# Patient Record
Sex: Male | Born: 1970 | Race: White | Hispanic: No | Marital: Single | Smoking: Current every day smoker
Health system: Southern US, Community
[De-identification: ages and names within clinical notes are randomized; demographics above are authoritative.]

## PROBLEM LIST (undated history)

## (undated) DIAGNOSIS — F1510 Other stimulant abuse, uncomplicated: Secondary | ICD-10-CM

## (undated) DIAGNOSIS — F1190 Opioid use, unspecified, uncomplicated: Secondary | ICD-10-CM

## (undated) HISTORY — PX: LEG SURGERY: SHX1003

## (undated) HISTORY — PX: ARM WOUND REPAIR / CLOSURE: SUR1141

## (undated) SURGERY — ECHOCARDIOGRAM, TRANSESOPHAGEAL
Anesthesia: Moderate Sedation | Laterality: Right

---

## 2004-02-27 ENCOUNTER — Emergency Department: Payer: Self-pay | Admitting: Emergency Medicine

## 2005-10-07 ENCOUNTER — Emergency Department: Payer: Self-pay | Admitting: Emergency Medicine

## 2009-07-09 ENCOUNTER — Emergency Department: Payer: Self-pay | Admitting: Emergency Medicine

## 2009-08-28 ENCOUNTER — Emergency Department: Payer: Self-pay | Admitting: Emergency Medicine

## 2010-08-21 ENCOUNTER — Emergency Department: Payer: Self-pay | Admitting: Emergency Medicine

## 2010-08-24 ENCOUNTER — Emergency Department: Payer: Self-pay | Admitting: Emergency Medicine

## 2011-11-27 ENCOUNTER — Ambulatory Visit: Payer: Self-pay | Admitting: Unknown Physician Specialty

## 2011-11-27 ENCOUNTER — Inpatient Hospital Stay: Payer: Self-pay | Admitting: Specialist

## 2011-11-27 LAB — DRUG SCREEN, URINE
Amphetamines, Ur Screen: NEGATIVE (ref ?–1000)
Barbiturates, Ur Screen: NEGATIVE (ref ?–200)
Benzodiazepine, Ur Scrn: NEGATIVE (ref ?–200)
Cannabinoid 50 Ng, Ur ~~LOC~~: POSITIVE (ref ?–50)
MDMA (Ecstasy)Ur Screen: NEGATIVE (ref ?–500)
Methadone, Ur Screen: NEGATIVE (ref ?–300)
Phencyclidine (PCP) Ur S: NEGATIVE (ref ?–25)

## 2011-11-27 LAB — BASIC METABOLIC PANEL
Anion Gap: 10 (ref 7–16)
Calcium, Total: 8.8 mg/dL (ref 8.5–10.1)
Co2: 24 mmol/L (ref 21–32)
EGFR (African American): >60
EGFR (Non-African Amer.): >60
Glucose: 77 mg/dL (ref 65–99)
Osmolality: 288 (ref 275–301)
Potassium: 3.9 mmol/L (ref 3.5–5.1)

## 2011-11-27 LAB — CBC WITH DIFFERENTIAL/PLATELET
Basophil #: 0.1 10*3/uL (ref 0.0–0.1)
Basophil %: 0.4 %
HCT: 48.2 % (ref 40.0–52.0)
HGB: 15.9 g/dL (ref 13.0–18.0)
Lymphocyte #: 2 10*3/uL (ref 1.0–3.6)
Lymphocyte %: 16.6 %
MCHC: 33 g/dL (ref 32.0–36.0)
Neutrophil #: 9.1 10*3/uL — ABNORMAL HIGH (ref 1.4–6.5)
Platelet: 208 10*3/uL (ref 150–440)
RBC: 5.38 10*6/uL (ref 4.40–5.90)

## 2011-11-27 LAB — URINALYSIS, COMPLETE
Bacteria: NONE SEEN
Bilirubin,UR: NEGATIVE
Blood: NEGATIVE
Leukocyte Esterase: NEGATIVE
Nitrite: NEGATIVE
Ph: 6 (ref 4.5–8.0)
Protein: NEGATIVE
Squamous Epithelial: <1

## 2011-11-27 LAB — PROTIME-INR: INR: 0.9

## 2011-11-27 LAB — APTT: Activated PTT: 23.1 secs — ABNORMAL LOW (ref 23.6–35.9)

## 2011-11-27 LAB — ETHANOL
Ethanol %: 0.221 % — ABNORMAL HIGH (ref 0.000–0.080)
Ethanol: 221 mg/dL

## 2011-12-01 ENCOUNTER — Emergency Department: Payer: Self-pay | Admitting: Emergency Medicine

## 2012-10-28 ENCOUNTER — Emergency Department: Payer: Self-pay | Admitting: Internal Medicine

## 2012-10-28 ENCOUNTER — Emergency Department: Payer: Self-pay | Admitting: Emergency Medicine

## 2013-03-21 LAB — CBC
HGB: 15.3 g/dL (ref 13.0–18.0)
MCH: 29.7 pg (ref 26.0–34.0)
MCHC: 34.3 g/dL (ref 32.0–36.0)
Platelet: 240 10*3/uL (ref 150–440)
RBC: 5.15 10*6/uL (ref 4.40–5.90)
RDW: 14.7 % — ABNORMAL HIGH (ref 11.5–14.5)
WBC: 8.9 10*3/uL (ref 3.8–10.6)

## 2013-03-21 LAB — DRUG SCREEN, URINE
Benzodiazepine, Ur Scrn: NEGATIVE (ref ?–200)
Cocaine Metabolite,Ur ~~LOC~~: POSITIVE (ref ?–300)
MDMA (Ecstasy)Ur Screen: NEGATIVE (ref ?–500)
Methadone, Ur Screen: NEGATIVE (ref ?–300)
Opiate, Ur Screen: NEGATIVE (ref ?–300)

## 2013-03-21 LAB — COMPREHENSIVE METABOLIC PANEL
Alkaline Phosphatase: 89 U/L
Calcium, Total: 9.2 mg/dL (ref 8.5–10.1)
Chloride: 110 mmol/L — ABNORMAL HIGH (ref 98–107)
Co2: 24 mmol/L (ref 21–32)
Creatinine: 0.95 mg/dL (ref 0.60–1.30)
EGFR (African American): >60
EGFR (Non-African Amer.): >60
SGOT(AST): 35 U/L (ref 15–37)
SGPT (ALT): 29 U/L (ref 12–78)
Total Protein: 8.4 g/dL — ABNORMAL HIGH (ref 6.4–8.2)

## 2013-03-21 LAB — ETHANOL
Ethanol %: 0.291 % — ABNORMAL HIGH (ref 0.000–0.080)
Ethanol: 291 mg/dL

## 2013-03-21 LAB — ACETAMINOPHEN LEVEL: Acetaminophen: <2 ug/mL

## 2013-03-22 ENCOUNTER — Inpatient Hospital Stay: Payer: Self-pay | Admitting: Psychiatry

## 2013-03-22 LAB — ETHANOL: Ethanol: <3 mg/dL

## 2013-04-09 ENCOUNTER — Emergency Department: Payer: Self-pay | Admitting: Emergency Medicine

## 2013-04-09 LAB — CBC
HCT: 45.5 % (ref 40.0–52.0)
HGB: 15.4 g/dL (ref 13.0–18.0)
MCH: 29.5 pg (ref 26.0–34.0)
MCHC: 33.8 g/dL (ref 32.0–36.0)
MCV: 87 fL (ref 80–100)
Platelet: 210 10*3/uL (ref 150–440)
RBC: 5.22 10*6/uL (ref 4.40–5.90)
RDW: 15.4 % — ABNORMAL HIGH (ref 11.5–14.5)
WBC: 14 10*3/uL — ABNORMAL HIGH (ref 3.8–10.6)

## 2013-04-09 LAB — BASIC METABOLIC PANEL
Anion Gap: 7 (ref 7–16)
Calcium, Total: 9.1 mg/dL (ref 8.5–10.1)
Chloride: 104 mmol/L (ref 98–107)
Creatinine: 1.09 mg/dL (ref 0.60–1.30)
Glucose: 84 mg/dL (ref 65–99)
Potassium: 3.9 mmol/L (ref 3.5–5.1)

## 2013-04-09 LAB — ETHANOL
Ethanol %: 0.02 % (ref 0.000–0.080)
Ethanol: 20 mg/dL

## 2013-04-11 ENCOUNTER — Emergency Department: Payer: Self-pay | Admitting: Emergency Medicine

## 2013-04-14 ENCOUNTER — Emergency Department: Payer: Self-pay | Admitting: Internal Medicine

## 2013-04-14 LAB — DRUG SCREEN, URINE
AMPHETAMINES, UR SCREEN: NEGATIVE (ref ?–1000)
BARBITURATES, UR SCREEN: NEGATIVE (ref ?–200)
BENZODIAZEPINE, UR SCRN: NEGATIVE (ref ?–200)
Cannabinoid 50 Ng, Ur ~~LOC~~: NEGATIVE (ref ?–50)
Cocaine Metabolite,Ur ~~LOC~~: POSITIVE (ref ?–300)
MDMA (Ecstasy)Ur Screen: NEGATIVE (ref ?–500)
Methadone, Ur Screen: NEGATIVE (ref ?–300)
OPIATE, UR SCREEN: POSITIVE (ref ?–300)
Phencyclidine (PCP) Ur S: NEGATIVE (ref ?–25)
Tricyclic, Ur Screen: NEGATIVE (ref ?–1000)

## 2013-04-16 ENCOUNTER — Emergency Department: Payer: Self-pay | Admitting: Emergency Medicine

## 2013-04-18 ENCOUNTER — Emergency Department: Payer: Self-pay | Admitting: Emergency Medicine

## 2013-05-28 ENCOUNTER — Emergency Department: Payer: Self-pay | Admitting: Emergency Medicine

## 2013-05-28 LAB — COMPREHENSIVE METABOLIC PANEL
ALK PHOS: 98 U/L
ANION GAP: 4 — AB (ref 7–16)
Albumin: 4.1 g/dL (ref 3.4–5.0)
BILIRUBIN TOTAL: 0.3 mg/dL (ref 0.2–1.0)
BUN: 7 mg/dL (ref 7–18)
Calcium, Total: 8.6 mg/dL (ref 8.5–10.1)
Chloride: 110 mmol/L — ABNORMAL HIGH (ref 98–107)
Co2: 25 mmol/L (ref 21–32)
Creatinine: 1.08 mg/dL (ref 0.60–1.30)
Glucose: 98 mg/dL (ref 65–99)
Osmolality: 275 (ref 275–301)
Potassium: 3.9 mmol/L (ref 3.5–5.1)
SGOT(AST): 37 U/L (ref 15–37)
SGPT (ALT): 28 U/L (ref 12–78)
Sodium: 139 mmol/L (ref 136–145)
TOTAL PROTEIN: 8.3 g/dL — AB (ref 6.4–8.2)

## 2013-05-28 LAB — DRUG SCREEN, URINE

## 2013-05-28 LAB — CBC
HCT: 49.2 % (ref 40.0–52.0)
HGB: 16.3 g/dL (ref 13.0–18.0)
MCH: 29.5 pg (ref 26.0–34.0)
MCHC: 33 g/dL (ref 32.0–36.0)
MCV: 89 fL (ref 80–100)
Platelet: 225 10*3/uL (ref 150–440)
RBC: 5.51 10*6/uL (ref 4.40–5.90)
RDW: 15.7 % — AB (ref 11.5–14.5)
WBC: 5.9 10*3/uL (ref 3.8–10.6)

## 2013-05-28 LAB — URINALYSIS, COMPLETE
BACTERIA: NONE SEEN
Bilirubin,UR: NEGATIVE
Blood: NEGATIVE
GLUCOSE, UR: NEGATIVE mg/dL (ref 0–75)
Ketone: NEGATIVE
Leukocyte Esterase: NEGATIVE
NITRITE: NEGATIVE
Ph: 6 (ref 4.5–8.0)
Protein: NEGATIVE
RBC,UR: <1 /HPF (ref 0–5)
SPECIFIC GRAVITY: 1 (ref 1.003–1.030)
Squamous Epithelial: NONE SEEN
WBC UR: NONE SEEN /HPF (ref 0–5)

## 2013-05-28 LAB — SALICYLATE LEVEL: SALICYLATES, SERUM: 3.4 mg/dL — AB

## 2013-05-28 LAB — ETHANOL
ETHANOL %: 0.254 % — AB (ref 0.000–0.080)
Ethanol: 254 mg/dL

## 2013-05-28 LAB — ACETAMINOPHEN LEVEL: Acetaminophen: <2 ug/mL

## 2013-05-30 ENCOUNTER — Emergency Department: Payer: Self-pay | Admitting: Emergency Medicine

## 2014-07-29 NOTE — Op Note (Signed)
PATIENT NAME:  Reece LeaderRIDGEWAY, Gregg R MR#:  [id number] DATE OF BIRTH:  09/11/1970  DATE OF PROCEDURE:  11/27/2011  PREOPERATIVE DIAGNOSIS: Grade 2 open fracture left ulna.   POSTOPERATIVE DIAGNOSIS: Grade 2 open fracture left ulna.   PROCEDURE:  1. Open reduction, internal fixation left ulna.  2. Debridement of ulnar wound with debridement of subcutaneous tissue and a small amount of muscle.   SURGEON: Winn JockJames C. Auryn Paige, M.D.   ASSISTANT: None.   ANESTHESIA: General.   ESTIMATED BLOOD LOSS: Minimal.   TOURNIQUET PRESSURE: 250 mmHg.  TOURNIQUET TIME: 48 minutes.   IMPLANTS USED: Synthes nine-hole locking plate.   BRIEF CLINICAL NOTE AND PATHOLOGY: The patient suffered the above-mentioned fracture with a pickax. Reportedly a relatively clean instrument.  He was evaluated in the Emergency Room. No definite neurovascular compromise. Very difficult historian. The patient was intoxicated.   Options, risks, and benefits were discussed. At the time of the surgery there was minimal contamination. There was a small amount of nonviable muscle. There was a small amount of black foreign material on the bone. The fracture reduced nicely. Hardware had good fixation.   The injury appeared to be isolated to the ulna.   DESCRIPTION OF PROCEDURE: Adequate general anesthesia, supine position, routine prepping and draping. Appropriate time-out was called. A thorough Betadine prep had been performed. The initial wound was explored. It was approximately 3.5 inches long, oblique across the fracture site. This was then extended proximal and distal and directly along the ulnar shaft. The lateral aspect of the ulna was exposed. The area was thoroughly irrigated with pulsating lavage. Initially 3 liters were used. The wound was debrided. A small amount of muscle and subcutaneous tissue were removed. One small bone fragment was removed which was not attached to any soft tissue.   The fracture was then reduced under  direct visualization. A large butterfly fragment was secured to the proximal fragment with a lag screw through the plate. The plate was then attached proximally. The fracture was then reduced distally and secured in routine fashion with a combination of lag screws, cortical screws, and locking screws. All had excellent purchase. AP, lateral, and oblique views showed good positioning. The area was again thoroughly irrigated with pulsating lavage. Tourniquet was released. Hemostasis was good. The incision was closed using 0 Vicryl where the incision actually had been made. A staple was used in that same area. Nylon was used in the area of the open initial injury.   Soft sterile dressing was applied. Sponge and needle counts were reported as correct prior to and after wound closure. The patient was taken to the postanesthesia care unit having tolerated the procedure well.     ____________________________ Winn JockJames C. Gerrit Heckaliff, MD jcc:bjt D: 11/27/2011 09:59:42 ET T: 11/27/2011 13:04:45 ET JOB#: [id number]  cc: Winn JockJames C. Gerrit Heckaliff, MD, <Dictator> Winn JockJAMES C Candelario Steppe MD ELECTRONICALLY SIGNED 11/28/2011 11:42

## 2014-07-29 NOTE — H&P (Signed)
Subjective/Chief Complaint Left forearm open fracture    History of Present Illness Struck with a pickaxe on left forearm. Presented with open fracture of ulna. Denies other injuries. Diffuse numbness.    Past History Denies    Primary Physician None   Past Med/Surgical Hx:  gws to testicals.:   fx lower left ribs one month ago:   denies:   ALLERGIES:  No Known Allergies:   Family and Social History:   Family History Non-Contributory    Social History positive  tobacco, positive ETOH, positive Illicit drugs    + Tobacco Current (within 1 year)    Place of Living Home   Review of Systems:   Fever/Chills No    Cough No    Sputum No    Abdominal Pain No    Diarrhea No    Constipation No    Nausea/Vomiting No    SOB/DOE No    Chest Pain No    Dysuria No    Tolerating Diet Yes   Physical Exam:   GEN well developed    HEENT PERRL    NECK supple    RESP clear BS  Nontender    CARD regular rate  no murmur    ABD denies tenderness  soft    LYMPH negative neck, negative axillae    EXTR Dressing left forearm. Diffuse numbness of hand. Motor appears intact. Difficult to get full cooperation    SKIN Normal except laceration    NEURO motor/sensory function intact    PSYCH alert   Lab Results: Routine Chem:  18-Aug-13 05:12    Ethanol, S. 221   Ethanol % (comp)  0.221 (Result(s) reported on 27 Nov 2011 at 05:37AM.)   Glucose, Serum 77   BUN  20   Creatinine (comp) 1.22   Sodium, Serum 144   Potassium, Serum 3.9   Chloride, Serum  110   CO2, Serum 24   Calcium (Total), Serum 8.8   Anion Gap 10   Osmolality (calc) 288   eGFR (African American) >60   eGFR (Non-African American) >60 (eGFR values <20m/min/1.73 m2 may be an indication of chronic kidney disease (CKD). Calculated eGFR is useful in patients with stable renal function. The eGFR calculation will not be reliable in acutely ill patients when serum creatinine is changing rapidly.  It is not useful in  patients on dialysis. The eGFR calculation may not be applicable to patients at the low and high extremes of body sizes, pregnant women, and vegetarians.)  Urine Drugs:  [id number]   Tricyclic Antidepressant, Ur Qual (comp) NEGATIVE (Result(s) reported on 27 Nov 2011 at 05:47AM.)   Amphetamines, Urine Qual. NEGATIVE   MDMA, Urine Qual. NEGATIVE   Opiate, Urine qual NEGATIVE   Phencyclidine, Urine Qual. NEGATIVE   Barbiturates, Urine Qual. NEGATIVE   Benzodiazepine, Urine Qual. NEGATIVE (----------------- The URINE DRUG SCREEN provides only a preliminary, unconfirmed analytical test result and should not be used for non-medical  purposes.  Clinical consideration and professional judgment should be  applied to any positive drug screen result due to possible interfering substances.  A more specific alternate chemical method must be used in order to obtain a confirmed analytical result.  Gas chromatography/mass spectrometry (GC/MS) is the preferred confirmatory method.)   Methadone, Urine Qual. NEGATIVE  Routine UA:  18-Aug-13 05:27    Color (UA) Straw   Clarity (UA) Clear   Glucose (UA) Negative   Bilirubin (UA) Negative   Ketones (UA) Negative  Specific Gravity (UA) 1.008   Blood (UA) Negative   pH (UA) 6.0   Protein (UA) Negative   Nitrite (UA) Negative   Leukocyte Esterase (UA) Negative (Result(s) reported on 27 Nov 2011 at 05:50AM.)   RBC (UA) <1 /HPF   WBC (UA) NONE SEEN   Bacteria (UA) NONE SEEN   Epithelial Cells (UA) <1 /HPF   Mucous (UA) PRESENT (Result(s) reported on 27 Nov 2011 at 05:50AM.)  Routine Coag:  18-Aug-13 05:12    Prothrombin 12.0   INR 0.9 (INR reference interval applies to patients on anticoagulant therapy. A single INR therapeutic range for coumarins is not optimal for all indications; however, the suggested range for most indications is 2.0 - 3.0. Exceptions to the INR Reference Range may include: Prosthetic  heart valves, acute myocardial infarction, prevention of myocardial infarction, and combinations of aspirin and anticoagulant. The need for a higher or lower target INR must be assessed individually. Reference: The Pharmacology and Management of the Vitamin K  antagonists: the seventh ACCP Conference on Antithrombotic and Thrombolytic Therapy. MKLKJ.1791 Sept:126 (3suppl): N9146842. A HCT value >55% may artifactually increase the PT.  In one study,  the increase was an average of 25%. Reference:  "Effect on Routine and Special Coagulation Testing Values of Citrate Anticoagulant Adjustment in Patients with High HCT Values." American Journal of Clinical Pathology 2006;126:400-405.)   Activated PTT (APTT)  23.1 (A HCT value >55% may artifactually increase the APTT. In one study, the increase was an average of 19%. Reference: "Effect on Routine and Special Coagulation Testing Values of Citrate Anticoagulant Adjustment in Patients with High HCT Values." American Journal of Clinical Pathology 2006;126:400-405.)  Routine Hem:  18-Aug-13 05:12    WBC (CBC)  11.9   RBC (CBC) 5.38   Hemoglobin (CBC) 15.9   Hematocrit (CBC) 48.2   Platelet Count (CBC) 208   MCV 90   MCH 29.6   MCHC 33.0   RDW  14.9   Neutrophil % 76.4   Lymphocyte % 16.6   Monocyte % 5.5   Eosinophil % 1.1   Basophil % 0.4   Neutrophil #  9.1   Lymphocyte # 2.0   Monocyte # 0.7   Eosinophil # 0.1   Basophil # 0.1 (Result(s) reported on 27 Nov 2011 at 05:37AM.)     Assessment/Admission Diagnosis 1. Open left ulna fracture without definite NV injury 2. Alcohol intoxication 3. Tobacco abuse    Plan ORIF ulna, wound debridement. Discussed in detail risks and options, particularly infection,non union, stiffness, need for hardware removal and other surgery   Electronic Signatures: Lynnda Shields (MD)  (Signed [phone] 06:48)  Authored: CHIEF COMPLAINT and HISTORY, PAST MEDICAL/SURGIAL HISTORY, ALLERGIES, FAMILY AND  SOCIAL HISTORY, REVIEW OF SYSTEMS, PHYSICAL EXAM, LABS, ASSESSMENT AND PLAN   Last Updated: 18-Aug-13 06:48 by Lynnda Shields (MD)

## 2014-08-01 NOTE — Discharge Summary (Signed)
PATIENT NAME:  Erik Barker, Erik Barker MR#:  [id number] DATE OF BIRTH:  04/18/1970  DATE OF ADMISSION:  03/22/2013 DATE OF DISCHARGE:  03/25/2013.   HOSPITAL COURSE: See dictated history and physical for details of admission. This 44 year old man was admitted to the hospital intoxicated and making suicidal statements. Over the weekend he has detoxed and has not had a seizure or shown any sign of delirium. He completely denies any suicidal ideation. He did eventually speak to his family who confirmed to him that he had made suicidal statements while intoxicated, which he does not remember. The patient has been educated about the nature of blackouts and the dangerousness at this level of drinking creates. He was placed on Celexa, but today is refusing the medicine saying he does not like the side effects. He says his mood is feeling much better, and he denies any suicidal ideation. He has been counseled about the importance of monitoring his mood and following up with outpatient treatment so the depression can be treated if necessary and he agrees to the plan. He is not interested in going to inpatient rehab treatment instead will be discharged today with [facility]   MENTAL STATUS EXAM AT DISCHARGE: Neatly groomed man who looks his stated age, cooperative with the interview. Good eye contact. Normal psychomotor activity. Speech quiet and decreased in amount but easy to understand. Affect, smiling euthymic, reactive. Mood stated as good. Thoughts are lucid. No obvious loosening of associations or delusions. Denies auditory or visual hallucinations. Denies suicidal or homicidal ideation. Improved insight and judgment. Normal intelligence. Alert and oriented x 4.   LABORATORY DATA: At the time of admission included a chemistry panel with slightly elevated protein and chloride. No other abnormalities. Drug screen positive for cocaine. Alcohol level initially 291. Follow up the next day,  alcohol level of 0. Hematology panel unremarkable.   DISCHARGE MEDICATIONS: None.   DISPOSITION: Discharge home. Follow up with [facility] tomorrow.   DIAGNOSES:  PRINCIPAL AND PRIMARY AXIS I: Alcohol dependence.   SECONDARY DIAGNOSES: AXIS I: Substance-induced mood disorder.  AXIS II: Deferred.  AXIS III: No diagnosis.  AXIS IV: Moderate from multiple social stresses.  AXIS V: Functioning at time of discharge: 60.  ____________________________ Erik AmelJohn T. Avionna Bower, Barker jtc:sg D: 03/25/2013 12:19:43 ET T: 03/25/2013 13:58:43 ET JOB#: [id number]  cc: Erik AmelJohn T. Nixon Sparr, Barker, <Dictator> Erik AmelJOHN T Erik Barker ELECTRONICALLY SIGNED 03/26/2013 11:02

## 2014-08-01 NOTE — H&P (Signed)
PATIENT NAME:  Erik Barker, Erik Barker MR#:  [id number] DATE OF BIRTH:  1970-08-16  DATE OF ADMISSION:  03/21/2013  IDENTIFYING INFORMATION AND CHIEF COMPLAINT: A 44 year old man presented to the Emergency Room intoxicated.   CHIEF COMPLAINT: "I want to kill myself."   HISTORY OF PRESENT ILLNESS: Information obtained from the patient and the chart. The patient reports that he has been drinking and having thoughts about wanting to die. Mood has been depressed. He is not a very forthcoming historian, and has a hard time giving me the exact details of that, but says that he has been depressed and feeling bad for probably about a month. He drinks anywhere from one to several times a week. Also admits he has been using some cocaine. He endorses decreased energy, poor sleep, poor functioning, hopelessness, helplessness. Denies any hallucinations or psychotic symptoms.   PAST PSYCHIATRIC HISTORY: Denies any history of psychiatric treatment ever in the past. No psychiatric hospitalizations. Denies any history of suicidal behavior. Denies ever being on any psychiatric medicine.   SUBSTANCE ABUSE HISTORY: As has not been in detox or rehab before. No history of seizures or delirium tremens.   FAMILY HISTORY: Negative.   SOCIAL HISTORY: Not married, lives with his brother and a girlfriend. He does not give a lot of other social history date right now.  Does not report feeling unsafe,   PAST MEDICAL HISTORY: No significant ongoing medical problems. On no medications. Has a history of a broken arm, but no other medical issues.   MEDICATIONS: None.   ALLERGIES: No known drug allergies.   REVIEW OF SYSTEMS: Depressed mood, fatigue, suicidal ideation. No homicidal ideation. No hallucinations, no pain.   MENTAL STATUS EXAMINATION: Disheveled gentleman looks older than his stated age. Barely cooperative. Eye contact minimal. Psychomotor activity limited. Speech decreased in total amount. Affect blunted. Mood stated  as bad. Thoughts are lucid. No obvious loosening of associations or delusions, but minimal in amount. Denies auditory or visual hallucinations. Endorses suicidal ideation. Denies homicidal ideation. Judgment and insight appear to be at least partially impaired. Alert and oriented x 4, appears to be of probably average intelligence.   PHYSICAL EXAMINATION: GENERAL: No acute medical distress obvious.  SKIN: No skin lesions noted.  HEENT: Pupils equal and reactive. Face symmetric.  MUSCULOSKELETAL: Full range of motion at all extremities. Normal gait. Strength and reflexes normal and symmetric throughout.  LUNGS: Clear, no wheezes.  HEART: Regular rate and rhythm.  ABDOMEN: Soft, nontender, normal bowel sounds.  VITAL SIGNS: Pulse 79, respirations 18, blood pressure 110/64.   LABORATORY RESULTS: Drug screen positive for cocaine. Alcohol level on presentation 291. Chemistry panel slightly high chloride 110. No other significant abnormalities. CBC is unremarkable.   ASSESSMENT: A 44 year old man presented intoxicated voicing suicidal ideation. He continues to voice suicidal ideation. Does not appear to have severe withdrawal symptoms but is depressed. Not obviously psychotic. Needs admission to the hospital for stabilization.   TREATMENT PLAN: Admit to psychiatry. Suicide precautions. Detox protocol in place. Reassess as he stabilizes for mental health treatment.   DIAGNOSIS, PRINCIPAL AND PRIMARY:   AXIS I: Depression, not otherwise specified.   SECONDARY DIAGNOSES: AXIS I: Alcohol dependence.   AXIS II: Deferred.   AXIS III: Alcohol withdrawal.   AXIS IV: Severe.   AXIS V: Functioning at time of evaluation 30.    ____________________________ Audery AmelJohn T. Clapacs, MD jtc:cc D: 03/21/2013 17:28:17 ET T: 03/21/2013 18:20:25 ET JOB#: [id number]  cc: Audery AmelJohn T. Clapacs, MD, <Dictator> Audery AmelJOHN T CLAPACS  MD ELECTRONICALLY SIGNED 03/22/2013 15:42

## 2014-08-02 NOTE — Consult Note (Signed)
PATIENT NAME:  Erik Barker, [name]:  [id number] DATE OF BIRTH:  05-17-1970  DATE OF CONSULTATION:  05/28/2013  REFERRING PHYSICIAN:  Enedina Finnerandolph N. Manson PasseyBrown, MD CONSULTING PHYSICIAN:  Wister Hoefle B. Jennet MaduroPucilowska, MD  IDENTIFYING DATA:  Mr. Erik Barker is a 44 year old male with history of alcoholism.    REASON FOR CONSULTATION:   To evaluate a suicidal patient.    CHIEF COMPLAINT:  "I'm fine now."  HISTORY OF PRESENT ILLNESS:  Mr. Erik Barker has a long history of drinking.  He was hospitalized at [facility] in December of 2014 for suicidal ideation and alcohol detox.  He reports that he was able to slow down his drinking but lately, in January, he started drinking more.  He denies drinking daily.  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  He was drinking with his brother last night and started making suicidal threats.  The brother brought him to the hospital. Reportedly, he had just 2 beers and 2 shots of vodka but his blood alcohol level on admission was 254. Apparently, the patient was arguing with his brother.  In the morning, when sober, the patient was no longer suicidal or homicidal and did not wish any treatment for alcoholism or for depression. He denies any symptoms of depression, anxiety or psychosis. He denies symptoms suggestive of bipolar mania.  He denies, other than alcohol, substance use,   PAST PSYCHIATRIC HISTORY:  A long history of alcohol abuse.  In the past, he has been tried on antidepressants but did not continue in the community and did not follow up with outpatient mental health provider.  He denies any history of suicidal behavior.  There was no substance abuse treatment.   FAMILY PSYCHIATRIC HISTORY:  Brother has a drinking problem too.    PAST MEDICAL HISTORY:  None reported.    MEDICATIONS ON ADMISSION: None.   ALLERGIES:  No known drug allergies.    SOCIAL HISTORY: He lives with his brother and sometimes with his sister.  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  There is no health insurance.  He does have some legal charges pending but would not tell me what they were.    REVIEW OF SYSTEMS:   CONSTITUTIONAL:  No fevers or chills. No weight changes.  EYES: No double or blurred vision.   ENT:  No hearing loss. RESPIRATORY:  No shortness of breath or cough.  CARDIOVASCULAR: No chest pain or orthopnea.  GASTROINTESTINAL:  No abdominal pain, nausea, vomiting or diarrhea.  GENITOURINARY:  No incontinence or frequency.   ENDOCRINOLOGY:  No heat or cold intolerance.  LYMPHATIC:  No anemia or easy bruising.  INTEGUMENTARY:  No acne or rash.   MUSCULOSKELETAL:  No muscle or joint pain.  NEUROLOGIC:  No tingling or weakness.   PSYCHIATRIC:  See history of present illness for details.  PHYSICAL EXAMINATION: VITAL SIGNS:  Blood pressure 112/64, pulse 80, respirations 18, temperature 97.4.   GENERAL:  This is a well-developed male in no acute distress.   The rest of the physical examination is deferred to his primary attending.    LABORATORY DATA:  Chemistries are within normal limits.  Blood alcohol 0.254.  LFTs within normal limits.  Urine tox screen negative for substances.  CBC within normal limits.  Urinalysis is not suggestive of urinary tract infection.  Serum acetaminophen and salicylates are low.    MENTAL STATUS  EXAMINATION:  The patient and oriented to person, place, time and  somewhat to situation. He is pleasant, polite and cooperative.  He is adequately groomed, has very long hair.  He is wearing hospital scrubs and a yellow shirt.  He maintains good eye contact. He speech is of normal rhythm, rate and volume. Mood is fine with full affect. Thought process is logical and goal-oriented. Thought content:  He denies suicidal or homicidal ideation. There are no delusions or paranoia. There are no auditory or visual hallucinations.  His cognition is  grossly intact. He registers 3 out of 3 and recalls 3 out of 3 objects after 5 minutes.  He can spell "world" forward and backwards.  He knows the current president.  He is of average intelligence with normal fund of knowledge.  His insight and judgment are poor.  DIAGNOSES: AXIS I:  Alcohol intoxication, alcohol dependence. AXIS II:  Deferred.   AXIS III:  Deferred.  AXIS IV:  Substance abuse, legal problems, family conflict.   AXIS V:  Global assessment of functioning 50.    PLAN:   1.  The patient no longer meets criteria for involuntary inpatient psychiatric commitment.  I will terminate proceedings.  Please discharge as appropriate. 2.  He does not wish substance abuse treatment. 3.  He does not wish any medications for presumed depression.   4.  He may follow up with [facility] for intensive outpatient program if he chooses to.  Information was given.  ____________________________ Ellin Goodie. Jennet Maduro, MD jbp:cs D: 05/28/2013 18:44:21 ET T: 05/28/2013 19:17:42 ET JOB#: [id number]  cc: Deanne Bedgood B. Jennet Maduro, MD, <Dictator> Shari Prows MD ELECTRONICALLY SIGNED 06/22/2013 14:38

## 2014-08-02 NOTE — Consult Note (Signed)
Brief Consult Note: Diagnosis: Alcohol intoxication, alcohol dependence.   Patient was seen by consultant.   Consult note dictated.   Recommend further assessment or treatment.   Discussed with Attending MD.   Comments: Mr. Pilar GrammesRandell has a h/o substance abuse. he came to the hospital suicidal while drunk. He is not longer suicidal or homicidal.   PLAN: 1. The patient no longer meets criteria for IVC. I will terminate p[roceedings. please discharge as appropriate.  2. [facility] but is not interested really.  3. No medications recommended.  Electronic Signatures: Kristine LineaPucilowska, Riah Kehoe (MD)  (Signed 17-Feb-15 14:26)  Authored: Brief Consult Note   Last Updated: 17-Feb-15 14:26 by Kristine LineaPucilowska, Ruqaya Strauss (MD)

## 2014-11-10 IMAGING — CT CT HEAD WITHOUT CONTRAST
4 series · 18 of 30 positions shown, 19 images · non-contrast
Comparison: Prior CT from 04/16/2013

CLINICAL DATA: Recent subdural hemorrhage with fall

EXAM:
CT HEAD WITHOUT CONTRAST
TECHNIQUE: Contiguous axial images were obtained from the base of the skull
through the vertex without intravenous contrast.

[Series 2: head wo · axial · 0.43mm/px · z∈[-26,+24]mm · 2 of 31 slices shown, 3 images]
[im 11/31  brain]
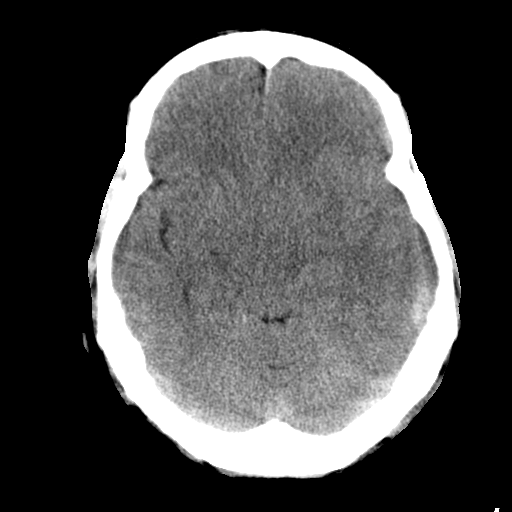
[im 11/31  bone]
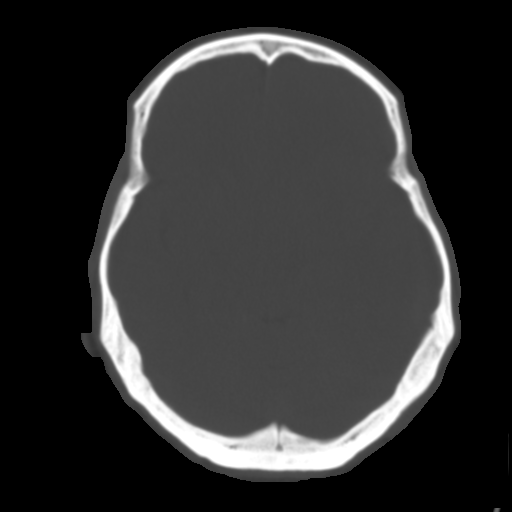
[im 21/31  brain]
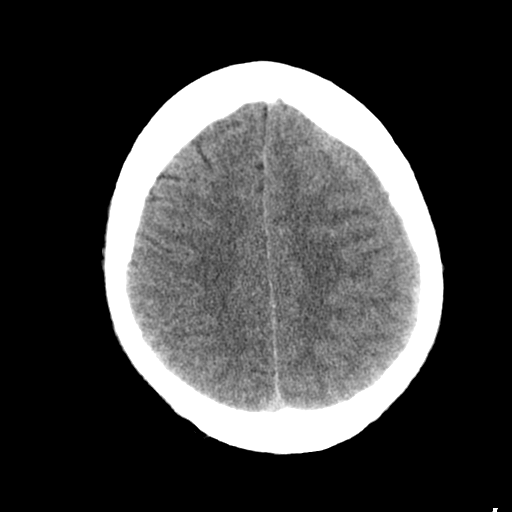

[Series 3: head bone · axial · 0.43mm/px · z∈[-68,+66]mm · 8 of 83 slices shown]
[im 8/83  bone]
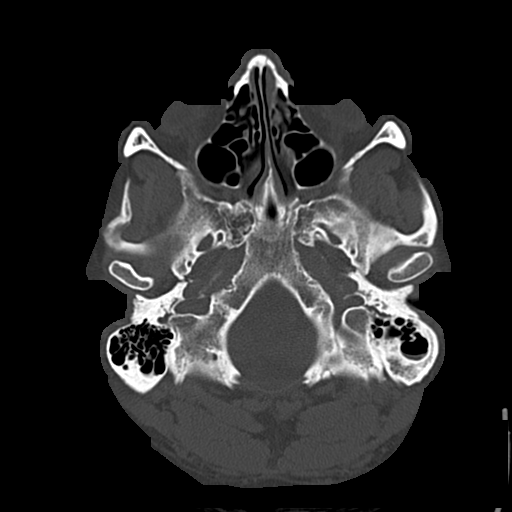
[im 15/83  bone]
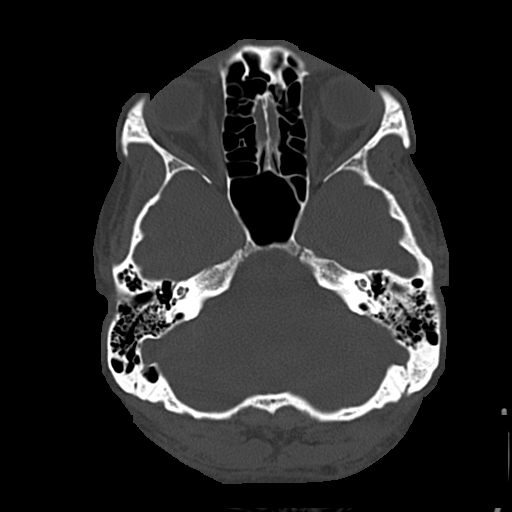
[im 30/83  bone]
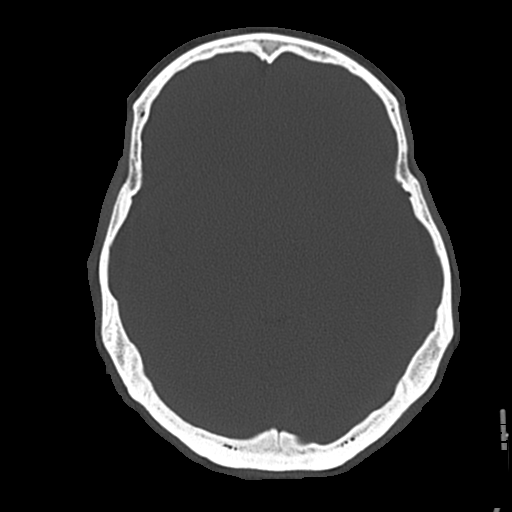
[im 38/83  bone]
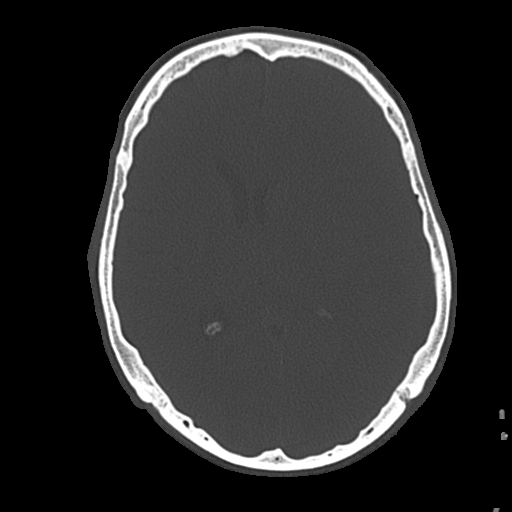
[im 45/83  bone]
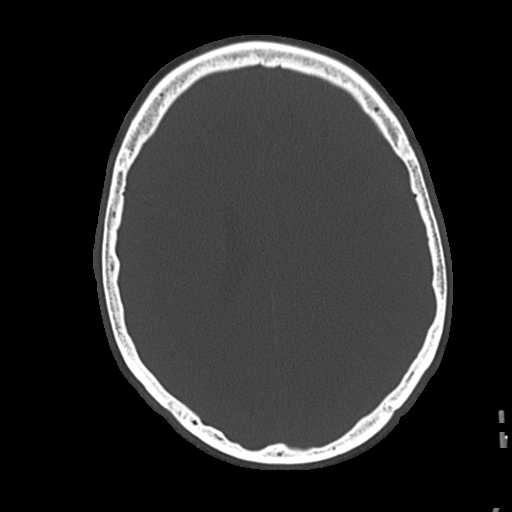
[im 53/83  bone]
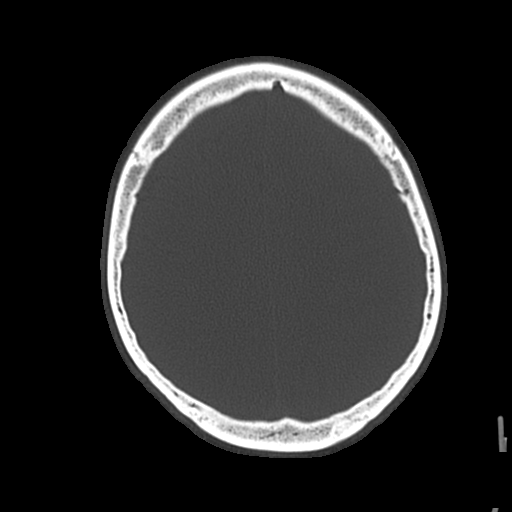
[im 68/83  bone]
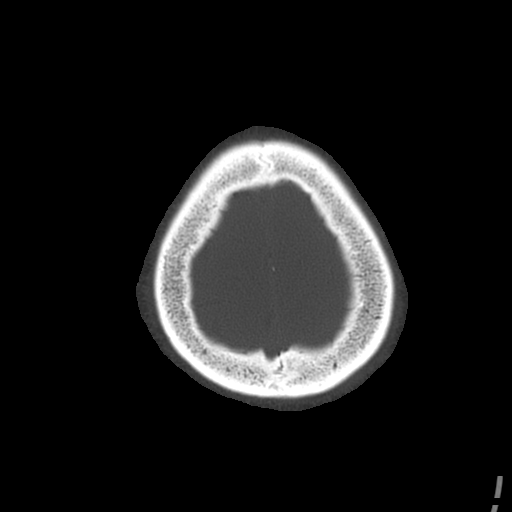
[im 75/83  bone]
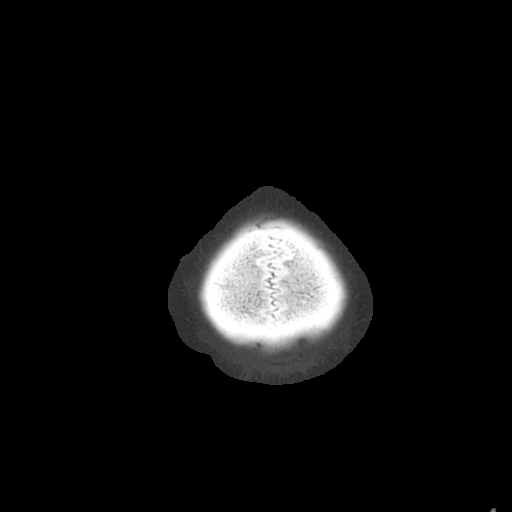

[Series 4: head wo recons · axial · 0.43mm/px · z∈[-7,+32]mm · 2 of 26 slices shown]
[im 9/26  brain]
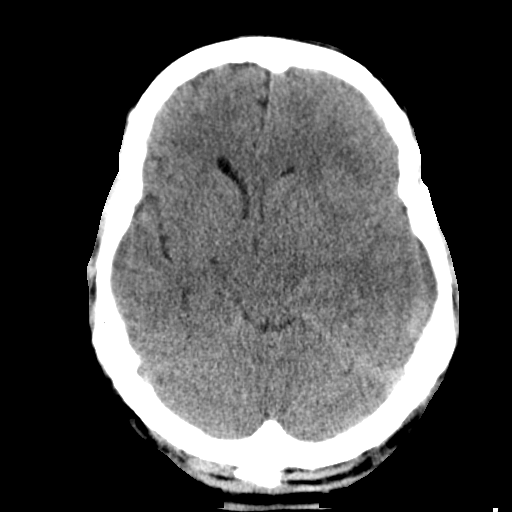
[im 17/26  brain]
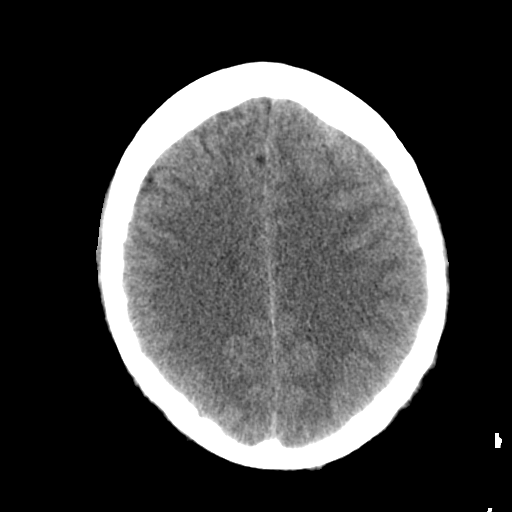

[Series 5: head bone recons · axial · 0.43mm/px · z∈[-26,+54]mm · 6 of 75 slices shown]
[im 9/75  bone]
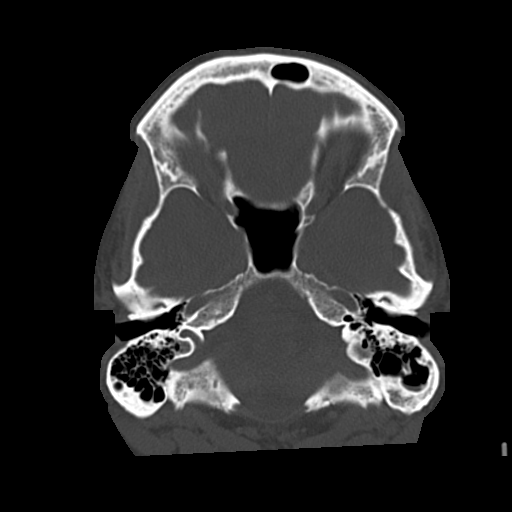
[im 17/75  bone]
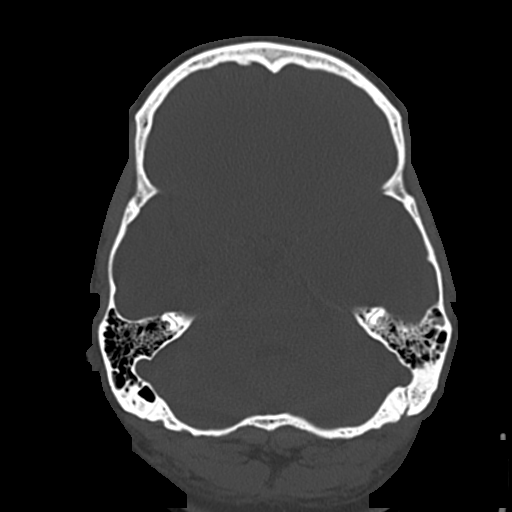
[im 25/75  bone]
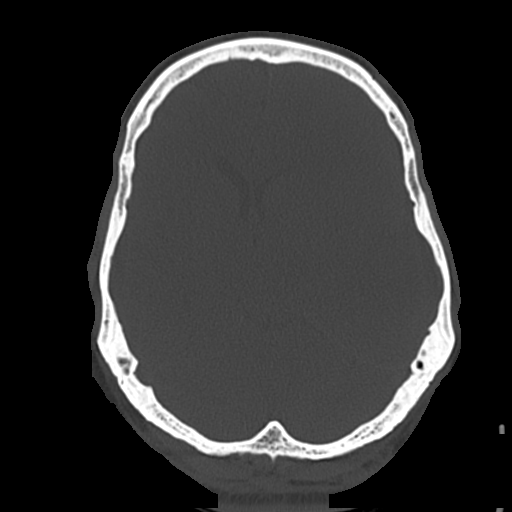
[im 33/75  bone]
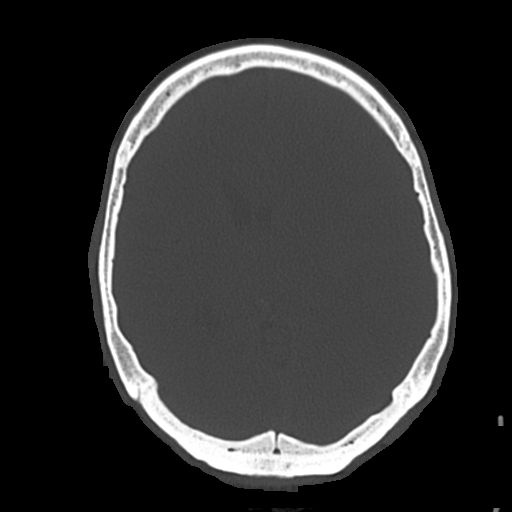
[im 42/75  bone]
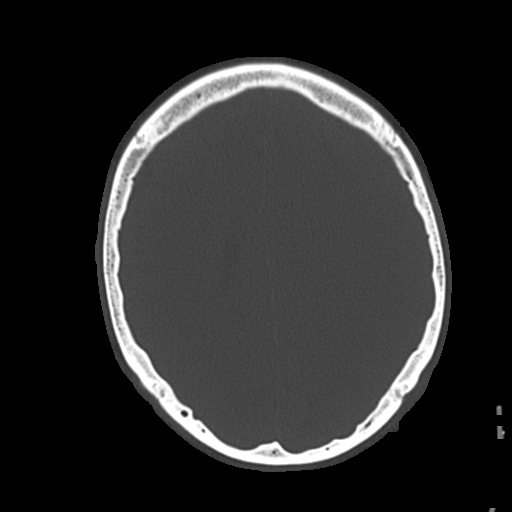
[im 50/75  bone]
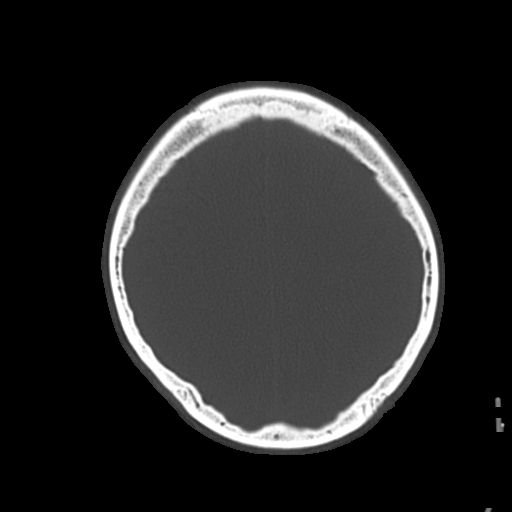

[18 of 30 positions shown; findings below may reference images not displayed]

FINDINGS: There has been interval evolution of previously identified left
convexity subdural hemorrhage with slightly less hyperdensity seen
within this collection as compared to the prior exam. The hemorrhage
measures approximately 6 mm in maximal diameter. Secondary
left-to-right midline shift is slightly increased now measuring
approximately 1 cm (previously 8 mm). There is partial effacement of
the left lateral ventricle with asymmetric enlargement of the
temporal horn of the right lateral ventricle, similar to prior. No
definite new intracranial hemorrhage identified. No large vessel
territory infarct.

The calvarium remains intact.  Orbits are unremarkable.

Paranasal sinuses are clear. Minimal opacity is noted within the
left mastoid air cells, unchanged. The right mastoid air cells are
clear.
IMPRESSION: Interval evolution of left convexity subdural hematoma with slightly
increase left-to-right midline shift is slightly increased now
measuring approximately 1 cm, previously 0.8 cm on 04/16/2013.

No new intracranial hemorrhage or infarct identified.

## 2020-07-22 ENCOUNTER — Emergency Department
Admission: EM | Admit: 2020-07-22 | Discharge: 2020-07-23 | Disposition: A | Discharge status: Home / Self Care | Payer: Self-pay | Attending: Emergency Medicine | Admitting: Emergency Medicine

## 2020-07-22 DIAGNOSIS — F17210 Nicotine dependence, cigarettes, uncomplicated: Secondary | ICD-10-CM | POA: Insufficient documentation

## 2020-07-22 DIAGNOSIS — F1010 Alcohol abuse, uncomplicated: Secondary | ICD-10-CM

## 2020-07-22 DIAGNOSIS — F29 Unspecified psychosis not due to a substance or known physiological condition: Secondary | ICD-10-CM

## 2020-07-22 DIAGNOSIS — Z046 Encounter for general psychiatric examination, requested by authority: Secondary | ICD-10-CM | POA: Insufficient documentation

## 2020-07-22 DIAGNOSIS — Z20822 Contact with and (suspected) exposure to covid-19: Secondary | ICD-10-CM | POA: Insufficient documentation

## 2020-07-22 DIAGNOSIS — F1510 Other stimulant abuse, uncomplicated: Secondary | ICD-10-CM

## 2020-07-22 DIAGNOSIS — F15151 Other stimulant abuse with stimulant-induced psychotic disorder with hallucinations: Secondary | ICD-10-CM | POA: Insufficient documentation

## 2020-07-22 DIAGNOSIS — F1290 Cannabis use, unspecified, uncomplicated: Secondary | ICD-10-CM

## 2020-07-22 DIAGNOSIS — F19951 Other psychoactive substance use, unspecified with psychoactive substance-induced psychotic disorder with hallucinations: Secondary | ICD-10-CM | POA: Diagnosis not present

## 2020-07-23 ENCOUNTER — Other Ambulatory Visit: Payer: Self-pay

## 2020-07-23 DIAGNOSIS — F1510 Other stimulant abuse, uncomplicated: Secondary | ICD-10-CM

## 2020-07-23 DIAGNOSIS — F19951 Other psychoactive substance use, unspecified with psychoactive substance-induced psychotic disorder with hallucinations: Secondary | ICD-10-CM

## 2020-07-23 DIAGNOSIS — F1010 Alcohol abuse, uncomplicated: Secondary | ICD-10-CM

## 2020-07-23 LAB — CBC
HCT: 46.4 % (ref 39.0–52.0)
Hemoglobin: 15.5 g/dL (ref 13.0–17.0)
MCH: 29.2 pg (ref 26.0–34.0)
MCHC: 33.4 g/dL (ref 30.0–36.0)
MCV: 87.5 fL (ref 80.0–100.0)
Platelets: 282 10*3/uL (ref 150–400)
RBC: 5.3 MIL/uL (ref 4.22–5.81)
RDW: 15.9 % — ABNORMAL HIGH (ref 11.5–15.5)
WBC: 7.8 10*3/uL (ref 4.0–10.5)
nRBC: 0 % (ref 0.0–0.2)

## 2020-07-23 LAB — SALICYLATE LEVEL: Salicylate Lvl: <7 mg/dL — ABNORMAL LOW (ref 7.0–30.0)

## 2020-07-23 LAB — COMPREHENSIVE METABOLIC PANEL
ALT: 74 U/L — ABNORMAL HIGH (ref 0–44)
AST: 171 U/L — ABNORMAL HIGH (ref 15–41)
Albumin: 4.3 g/dL (ref 3.5–5.0)
Alkaline Phosphatase: 73 U/L (ref 38–126)
Anion gap: 11 (ref 5–15)
BUN: 17 mg/dL (ref 6–20)
CO2: 23 mmol/L (ref 22–32)
Calcium: 9.2 mg/dL (ref 8.9–10.3)
Chloride: 105 mmol/L (ref 98–111)
Creatinine, Ser: 1.2 mg/dL (ref 0.61–1.24)
GFR, Estimated: >60 mL/min (ref >60)
Glucose, Bld: 96 mg/dL (ref 70–99)
Potassium: 3.5 mmol/L (ref 3.5–5.1)
Sodium: 139 mmol/L (ref 135–145)
Total Bilirubin: 1.2 mg/dL (ref 0.3–1.2)
Total Protein: 7.4 g/dL (ref 6.5–8.1)

## 2020-07-23 LAB — URINE DRUG SCREEN, QUALITATIVE (ARMC ONLY)
Amphetamines, Ur Screen: POSITIVE — AB
Barbiturates, Ur Screen: NOT DETECTED
Benzodiazepine, Ur Scrn: NOT DETECTED
Cannabinoid 50 Ng, Ur ~~LOC~~: POSITIVE — AB
Cocaine Metabolite,Ur ~~LOC~~: NOT DETECTED
MDMA (Ecstasy)Ur Screen: NOT DETECTED
Methadone Scn, Ur: NOT DETECTED
Opiate, Ur Screen: NOT DETECTED
Phencyclidine (PCP) Ur S: NOT DETECTED
Tricyclic, Ur Screen: NOT DETECTED

## 2020-07-23 LAB — RESP PANEL BY RT-PCR (FLU A&B, COVID) ARPGX2
Influenza A by PCR: NEGATIVE
Influenza B by PCR: NEGATIVE
SARS Coronavirus 2 by RT PCR: NEGATIVE

## 2020-07-23 LAB — ETHANOL: Alcohol, Ethyl (B): <10 mg/dL (ref <10)

## 2020-07-23 LAB — ACETAMINOPHEN LEVEL: Acetaminophen (Tylenol), Serum: <10 ug/mL — ABNORMAL LOW (ref 10–30)

## 2020-07-23 MED ORDER — ZIPRASIDONE MESYLATE 20 MG IM SOLR: 10.0000 mg | Freq: Once | INTRAMUSCULAR | Status: DC
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
## 2020-07-23 NOTE — ED Notes (Signed)
Pt has laid on bed at this time in room.
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
## 2020-07-23 NOTE — ED Notes (Signed)
Patient standing in doorway, reaching towards door frame as if swatting at imaginary bug/object.

## 2020-07-23 NOTE — ED Notes (Signed)
Pt provided with more water at this time, pt to room with tv on now.

## 2020-07-23 NOTE — ED Notes (Signed)
Hourly rounding performed, patient currently awake in dayroom. Patient has no complaints at this time. Q15 minute rounds and monitoring via Security Cameras to continue. 

## 2020-07-23 NOTE — ED Notes (Signed)
IVC, pending consult 

## 2020-07-23 NOTE — ED Notes (Signed)
Patient informed that he was being moved to the locked unit as he is now medically cleared. Patient agitated. Patient refusing. Patient stating "you might as well give me a shot". Patient asked if he wants a shot - patient responded in the negative. Patient threatening to tear apart room. [employer] and [employer] police officer's [place] and [place] speaking with patient. Patient agreed to go to locked unit.   During this encounter patient reports that he hears his family members saying his name. He believes his sick family member is in nearby room with Colon cancer. Patient reoriented; informed that he hears nursing talking at nursing station. Patient convinced he hears his family members. Patient trying actively to listen to conversation of family members (who are not present) despite this nurse and officer explaining that his family is not here.

## 2020-07-23 NOTE — ED Notes (Signed)
Patient discharged to home

## 2020-07-23 NOTE — BH Assessment (Signed)
Comprehensive Clinical Assessment (CCA) Note  07/23/2020 Erik Barker [id number]  Chief Complaint: Patient is a 50 year old male presenting to [facility] ED under IVC. Per triage note Patient coming in under IVC with [place] [employer] for hallucinations. Patient reportedly awoke and believed that snakes were crawling out of his legs and playing in his feces saying it was snakes. Patient reported to this RN that he had been in the yard yesterday and killed four snakes. Patient further reported that the window is broken where he is staying and that must be how the snakes got in. Patient reports he felt them go in through his abdomen - felt like he was shocked by a taser. Patient currently denies feeling any snakes moving. Patient diaphoretic at PD arrival. Patient reports sweating heavily at the time. During assessment patient appears alert and oriented x4, calm and cooperative, mood is pleasant, speech somewhat rapid. Patient reports "yesterday evening I was helping people clean and there were 4 copper head snakes, I don't know if somebody was fucking with me but I slung the snake off of me, it seemed like it was real and I could have swore it was real but I guess it was a dream." Patient also does admit that when he used the bathroom "I was looking around for the snakes." Patient reports that his sister is the one the contacted the police. Patient also admits to Methamphetamine use via smoking yesterday 06/21/20 "around 2pm." He reports "I took a couple of hits the people I was with use a lot more." Patient also reports a history of Cocaine use but reports that he hasn't used that since 2014. No current UDS available. Patient denies any past mental health history, he denies any current mental health medications and no current therapist or psychiatrist. Patient denies SI/HI/AH/VH and does not appear to be responding to any internal or external stimuli.  Per Psyc NP Elenore Paddy patient to be observed overnight and  reassessed  Chief Complaint  Patient presents with  . Hallucinations   Visit Diagnosis: Substance-Induced Psychotic Disorder, Methamphetamine use   CCA Screening, Triage and Referral (STR)  Patient Reported Information How did you hear about [place]? Legal System  Referral name: No data recorded Referral phone number: No data recorded  Whom do you see for routine medical problems? Other (Comment)  Practice/Facility Name: No data recorded Practice/Facility Phone Number: No data recorded Name of Contact: No data recorded Contact Number: No data recorded Contact Fax Number: No data recorded Prescriber Name: No data recorded Prescriber Address (if known): No data recorded  What Is the Reason for Your Visit/Call Today? No data recorded How Long Has This Been Causing You Problems? 1 wk - 1 month  What Do You Feel Would Help You the Most Today? No data recorded  Have You Recently Been in Any Inpatient Treatment (Hospital/Detox/Crisis Center/28-Day Program)? No  Name/Location of Program/Hospital:No data recorded How Long Were You There? No data recorded When Were You Discharged? No data recorded  Have You Ever Received Services From [facility] Before? No  Who Do You See at [facility]? No data recorded  Have You Recently Had Any Thoughts About Hurting Yourself? No  Are You Planning to Commit Suicide/Harm Yourself At This time? No   Have you Recently Had Thoughts About Hurting Someone Karolee Ohs? No  Explanation: No data recorded  Have You Used Any Alcohol or Drugs in the Past 24 Hours? Yes  How Long Ago Did You Use Drugs or Alcohol?  No data recorded What Did You Use and How Much? Methamphetamine   Do You Currently Have a Therapist/Psychiatrist? No  Name of Therapist/Psychiatrist: No data recorded  Have You Been Recently Discharged From Any Office Practice or Programs? No  Explanation of Discharge From Practice/Program: No data recorded    CCA Screening Triage Referral  Assessment Type of Contact: Face-to-Face  Is this Initial or Reassessment? No data recorded Date Telepsych consult ordered in [facility]:  No data recorded Time Telepsych consult ordered in [facility]:  No data recorded  Patient Reported Information Reviewed? Yes  Patient Left Without Being Seen? No data recorded Reason for Not Completing Assessment: No data recorded  Collateral Involvement: No data recorded  Does Patient Have a Court Appointed Legal Guardian? No data recorded Name and Contact of Legal Guardian: No data recorded If Minor and Not Living with Parent(s), Who has Custody? No data recorded Is CPS involved or ever been involved? Never  Is APS involved or ever been involved? Never   Patient Determined To Be At Risk for Harm To Self or Others Based on Review of Patient Reported Information or Presenting Complaint? No  Method: No data recorded Availability of Means: No data recorded Intent: No data recorded Notification Required: No data recorded Additional Information for Danger to Others Potential: No data recorded Additional Comments for Danger to Others Potential: No data recorded Are There Guns or Other Weapons in Your Home? No data recorded Types of Guns/Weapons: No data recorded Are These Weapons Safely Secured?                            No data recorded Who Could Verify You Are Able To Have These Secured: No data recorded Do You Have any Outstanding Charges, Pending Court Dates, Parole/Probation? No data recorded Contacted To Inform of Risk of Harm To Self or Others: No data recorded  Location of Assessment: [facility] ED   Does Patient Present under Involuntary Commitment? Yes  IVC Papers Initial File Date: 07/23/2020   [place] of Residence: [place]   Patient Currently Receiving the Following Services: No data recorded  Determination of Need: Emergent (2 hours)   Options For Referral: No data recorded    CCA Biopsychosocial Intake/Chief Complaint:  Patient  presents under IVC due to experiencing hallucinations  Current Symptoms/Problems: Patient presents under IVC due to experiencing hallucinations   Patient Reported Schizophrenia/Schizoaffective Diagnosis in Past: No   Strengths: Patient is able to communicate  Preferences: Unknown  Abilities: Patient is able to communicate   Type of Services Patient Feels are Needed: NOne   Initial Clinical Notes/Concerns: None   Mental Health Symptoms Depression:  None   Duration of Depressive symptoms: No data recorded  Mania:  None   Anxiety:   None   Psychosis:  Hallucinations   Duration of Psychotic symptoms: Less than six months   Trauma:  None   Obsessions:  None   Compulsions:  None   Inattention:  None   Hyperactivity/Impulsivity:  N/A   Oppositional/Defiant Behaviors:  None   Emotional Irregularity:  None   Other Mood/Personality Symptoms:  No data recorded   Mental Status Exam Appearance and self-care  Stature:  Average   Weight:  Average weight   Clothing:  Disheveled   Grooming:  Normal   Cosmetic use:  None   Posture/gait:  Normal   Motor activity:  Not Remarkable   Sensorium  Attention:  Normal   Concentration:  Normal  Orientation:  X5   Recall/memory:  Normal   Affect and Mood  Affect:  Congruent   Mood:  Anxious   Relating  Eye contact:  Normal   Facial expression:  Responsive   Attitude toward examiner:  Cooperative   Thought and Language  Speech flow: Clear and Coherent   Thought content:  Appropriate to Mood and Circumstances   Preoccupation:  None   Hallucinations:  Visual; Tactile   Organization:  No data recorded  [employer] of Knowledge:  Fair   Intelligence:  Average   Abstraction:  Concrete   Judgement:  Fair   Reality Testing:  Realistic   Insight:  Fair   Decision Making:  Impulsive   Social Functioning  Social Maturity:  Responsible   Social Judgement:  Normal   Stress   Stressors:  Other (Comment)   Coping Ability:  Normal   Skill Deficits:  None   Supports:  Family     Religion: Religion/Spirituality Are You A Religious Person?: No  Leisure/Recreation: Leisure / Recreation Do You Have Hobbies?: No  Exercise/Diet: Exercise/Diet Do You Exercise?: No Have You Gained or Lost A Significant Amount of Weight in the Past Six Months?: No Do You Follow a Special Diet?: No Do You Have Any Trouble Sleeping?: No   CCA Employment/Education Employment/Work Situation: Employment / Work Psychologist, occupational Employment situation: Unemployed Has patient ever been in the [employer]?: No  Education: Education Is Patient Currently Attending School?: No Did You Have An Individualized Education Program (IIEP): No Did You Have Any Difficulty At [employer]?: No Patient's Education Has Been Impacted by Current Illness: No   CCA Family/Childhood History Family and Relationship History: Family history Marital status: Single Are you sexually active?:  (Unknown) What is your sexual orientation?: Unknown Has your sexual activity been affected by drugs, alcohol, medication, or emotional stress?: Unknown Does patient have children?:  (Unknown)  Childhood History:  Childhood History Additional childhood history information: None reported Description of patient's relationship with caregiver when they were a child: None reported Patient's description of current relationship with people who raised him/her: None reported How were you disciplined when you got in trouble as a child/adolescent?: None reported Does patient have siblings?: Yes Number of Siblings: 1 Description of patient's current relationship with siblings: Patient currently lives with sister Did patient suffer any verbal/emotional/physical/sexual abuse as a child?: No Did patient suffer from severe childhood neglect?: No Has patient ever been sexually abused/assaulted/raped as an adolescent or adult?: No Was  the patient ever a victim of a crime or a disaster?: No Witnessed domestic violence?: No Has patient been affected by domestic violence as an adult?: No  Child/Adolescent Assessment:     CCA Substance Use Alcohol/Drug Use: Alcohol / Drug Use Pain Medications: See MAR Prescriptions: See MAR Over the Counter: See MAR History of alcohol / drug use?: Yes Substance #1 Name of Substance 1: Methamphetamine 1 - Last Use / Amount: 06/21/20 Substance #2 Name of Substance 2: Cocaine 2 - Last Use / Amount: 2014                     ASAM's:  Six Dimensions of Multidimensional Assessment  Dimension 1:  Acute Intoxication and/or Withdrawal Potential:      Dimension 2:  Biomedical Conditions and Complications:      Dimension 3:  Emotional, Behavioral, or Cognitive Conditions and Complications:     Dimension 4:  Readiness to Change:     Dimension 5:  Relapse,  Continued use, or Continued Problem Potential:     Dimension 6:  Recovery/Living Environment:     ASAM Severity Score:    ASAM Recommended Level of Treatment:     Substance use Disorder (SUD)    Recommendations for Services/Supports/Treatments:   Per Psyc NP Elenore PaddyJackie Thompson patient to be observed overnight and reassessed   DSM5 Diagnoses: Patient Active Problem List   Diagnosis Date Noted  . 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

## 2020-07-23 NOTE — Discharge Instructions (Addendum)
Please seek medical attention for any high fevers, chest pain, shortness of breath, change in behavior, persistent vomiting, bloody stool or any other new or concerning symptoms.  

## 2020-07-23 NOTE — ED Notes (Signed)
Psychiatry and TTS at bedside for consult.

## 2020-07-23 NOTE — ED Notes (Signed)
This RN, Beth NT, and [place] [employer] officer Dan Humphreys present while patient changed into hospital provided attire. Patient's belonging's placed into labeled bag. Patient's belongings include:  Pink lighter, open pack cigarettes, cell phone, blue jeans, grey t-shirt, blue boxers, brown belt, black hair tie, vape device.

## 2020-07-23 NOTE — ED Notes (Signed)
Patient rolled to [facility] by this RN and officers.

## 2020-07-23 NOTE — ED Notes (Signed)
Patient transferred from ED to [facility] room 3 after screening for contraband. Report received from [place], RN including Situation, Background, Assessment and Recommendations. Pt oriented to unit including Q15 minute rounds as well as the security cameras for their protection. Patient is alert and oriented, warm and dry in no acute distress. Patient denies SI, HI, and AVH. Pt. Encouraged to let this nurse know if needs arise.

## 2020-07-23 NOTE — ED Notes (Signed)
Pt is walking around unit between dayroom, pt room and restroom.

## 2020-07-23 NOTE — ED Notes (Signed)
ED Provider at bedside. 
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
## 2020-07-23 NOTE — ED Notes (Signed)
Pt provided with tv channel list after seen to be having trouble changing channels. Pt goes into story of hallucinations he was having in quad before he came to [facility]. States he thought he heard family member who is on hospice care and other family members talking. Pt states that all these events that are occurring he feels are not real but he still questions it.

## 2020-07-23 NOTE — ED Triage Notes (Signed)
Patient coming in under IVC with [place] [employer] for hallucinations. Patient reportedly awoke and believed that snakes were crawling out of his legs and playing in his feces saying it was snakes. Patient reported to this RN that he had been in the yard yesterday and killed four snakes. Patient further reported that the window is broken where he is staying and that must be how the snakes got in. Patient reports he felt them go in through his abdomen - felt like he was shocked by a taser. Patient currently denies feeling any snakes moving. Patient diaphoretic at PD arrival. Patient reports sweating heavily at the time.

## 2020-07-23 NOTE — ED Notes (Signed)
Patient speaking with Hydrographic surveyor.

## 2020-09-19 ENCOUNTER — Other Ambulatory Visit: Payer: Self-pay

## 2020-09-19 ENCOUNTER — Emergency Department
Admission: EM | Admit: 2020-09-19 | Discharge: 2020-09-19 | Disposition: A | Discharge status: Home / Self Care | Payer: Self-pay | Attending: Emergency Medicine | Admitting: Emergency Medicine

## 2020-09-19 DIAGNOSIS — Y93H2 Activity, gardening and landscaping: Secondary | ICD-10-CM | POA: Insufficient documentation

## 2020-09-19 DIAGNOSIS — L03115 Cellulitis of right lower limb: Secondary | ICD-10-CM | POA: Insufficient documentation

## 2020-09-19 DIAGNOSIS — T07XXXA Unspecified multiple injuries, initial encounter: Secondary | ICD-10-CM

## 2020-09-19 DIAGNOSIS — L02416 Cutaneous abscess of left lower limb: Secondary | ICD-10-CM | POA: Insufficient documentation

## 2020-09-19 DIAGNOSIS — L02415 Cutaneous abscess of right lower limb: Secondary | ICD-10-CM | POA: Insufficient documentation

## 2020-09-19 DIAGNOSIS — F17210 Nicotine dependence, cigarettes, uncomplicated: Secondary | ICD-10-CM | POA: Insufficient documentation

## 2020-09-19 DIAGNOSIS — W293XXA Contact with powered garden and outdoor hand tools and machinery, initial encounter: Secondary | ICD-10-CM | POA: Insufficient documentation

## 2020-09-19 DIAGNOSIS — S81832A Puncture wound without foreign body, left lower leg, initial encounter: Secondary | ICD-10-CM | POA: Insufficient documentation

## 2020-09-19 DIAGNOSIS — S81831A Puncture wound without foreign body, right lower leg, initial encounter: Secondary | ICD-10-CM | POA: Insufficient documentation

## 2020-09-19 DIAGNOSIS — L03116 Cellulitis of left lower limb: Secondary | ICD-10-CM | POA: Insufficient documentation

## 2020-09-19 DIAGNOSIS — L0390 Cellulitis, unspecified: Secondary | ICD-10-CM

## 2020-09-19 LAB — CBG MONITORING, ED: Glucose-Capillary: 99 mg/dL (ref 70–99)

## 2020-09-19 MED ORDER — SULFAMETHOXAZOLE-TRIMETHOPRIM 800-160 MG PO TABS: 1.0000 | ORAL_TABLET | Freq: Two times a day (BID) | ORAL | 0 refills | Status: DC

## 2020-09-19 MED ORDER — CLINDAMYCIN PHOSPHATE 600 MG/4ML IJ SOLN
600.0000 mg | Freq: Once | INTRAMUSCULAR | Status: DC
  Filled 2020-09-19: qty 4

## 2020-09-19 MED ORDER — IBUPROFEN 600 MG PO TABS: 600.0000 mg | ORAL_TABLET | Freq: Four times a day (QID) | ORAL | 0 refills | Status: DC | PRN

## 2020-09-19 MED ORDER — HYDROCODONE-ACETAMINOPHEN 5-325 MG PO TABS: 1.0000 | ORAL_TABLET | Freq: Four times a day (QID) | ORAL | 0 refills | Status: AC | PRN

## 2020-09-19 MED ORDER — CLINDAMYCIN PHOSPHATE 300 MG/2ML IJ SOLN
600.0000 mg | Freq: Once | INTRAMUSCULAR | Status: AC
  Administered 2020-09-19: 600 mg via INTRAMUSCULAR
  Filled 2020-09-19: qty 4

## 2020-09-19 MED ORDER — HYDROCODONE-ACETAMINOPHEN 5-325 MG PO TABS
1.0000 | ORAL_TABLET | Freq: Once | ORAL | Status: AC
  Administered 2020-09-19: 1 via ORAL
  Filled 2020-09-19: qty 1

## 2020-09-19 NOTE — Discharge Instructions (Addendum)
Follow-up with your primary care provider or urgent care if any continued problems or concerns.  You may also return to the emergency department if any severe worsening of your symptoms such as fever, nausea, vomiting, chills.  Use warm moist compresses to these areas frequently or sit in a tub of warm water which will help.  Begin taking antibiotics immediately twice a day until finished.  A prescription for ibuprofen for inflammation and a prescription for hydrocodone was sent to your pharmacy.  Your blood sugar was within normal limits.  Do not drive or operate machinery while taking the hydrocodone as it is a controlled substances and can cause drowsiness which may increase your risk for injury.

## 2020-09-19 NOTE — ED Triage Notes (Signed)
Pt with multiple areas of skin redness and swelling noted to body. Pt with abscess noted to left inner knee, left buttock.
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
## 2020-12-22 ENCOUNTER — Other Ambulatory Visit: Payer: Self-pay

## 2020-12-22 ENCOUNTER — Encounter: Payer: Self-pay | Admitting: Emergency Medicine

## 2020-12-22 ENCOUNTER — Emergency Department
Admission: EM | Admit: 2020-12-22 | Discharge: 2020-12-22 | Disposition: A | Discharge status: Home / Self Care | Payer: Self-pay | Attending: Emergency Medicine | Admitting: Emergency Medicine

## 2020-12-22 ENCOUNTER — Emergency Department: Payer: Self-pay

## 2020-12-22 DIAGNOSIS — F17210 Nicotine dependence, cigarettes, uncomplicated: Secondary | ICD-10-CM | POA: Insufficient documentation

## 2020-12-22 DIAGNOSIS — Z23 Encounter for immunization: Secondary | ICD-10-CM | POA: Insufficient documentation

## 2020-12-22 DIAGNOSIS — L03115 Cellulitis of right lower limb: Secondary | ICD-10-CM | POA: Insufficient documentation

## 2020-12-22 DIAGNOSIS — T148XXA Other injury of unspecified body region, initial encounter: Secondary | ICD-10-CM

## 2020-12-22 DIAGNOSIS — L03119 Cellulitis of unspecified part of limb: Secondary | ICD-10-CM

## 2020-12-22 LAB — CBC WITH DIFFERENTIAL/PLATELET
Abs Immature Granulocytes: 0.02 10*3/uL (ref 0.00–0.07)
Basophils Absolute: 0.1 10*3/uL (ref 0.0–0.1)
Basophils Relative: 1 %
Eosinophils Absolute: 0.3 10*3/uL (ref 0.0–0.5)
Eosinophils Relative: 4 %
HCT: 40.5 % (ref 39.0–52.0)
Hemoglobin: 13.9 g/dL (ref 13.0–17.0)
Immature Granulocytes: 0 %
Lymphocytes Relative: 23 %
Lymphs Abs: 1.9 10*3/uL (ref 0.7–4.0)
MCH: 30.4 pg (ref 26.0–34.0)
MCHC: 34.3 g/dL (ref 30.0–36.0)
MCV: 88.6 fL (ref 80.0–100.0)
Monocytes Absolute: 0.6 10*3/uL (ref 0.1–1.0)
Monocytes Relative: 8 %
Neutro Abs: 5.3 10*3/uL (ref 1.7–7.7)
Neutrophils Relative %: 64 %
Platelets: 215 10*3/uL (ref 150–400)
RBC: 4.57 MIL/uL (ref 4.22–5.81)
RDW: 14.5 % (ref 11.5–15.5)
WBC: 8.2 10*3/uL (ref 4.0–10.5)
nRBC: 0 % (ref 0.0–0.2)

## 2020-12-22 LAB — COMPREHENSIVE METABOLIC PANEL
ALT: 121 U/L — ABNORMAL HIGH (ref 0–44)
AST: 151 U/L — ABNORMAL HIGH (ref 15–41)
Albumin: 3.6 g/dL (ref 3.5–5.0)
Alkaline Phosphatase: 69 U/L (ref 38–126)
Anion gap: 9 (ref 5–15)
BUN: 15 mg/dL (ref 6–20)
CO2: 28 mmol/L (ref 22–32)
Calcium: 8.8 mg/dL — ABNORMAL LOW (ref 8.9–10.3)
Chloride: 100 mmol/L (ref 98–111)
Creatinine, Ser: 0.93 mg/dL (ref 0.61–1.24)
GFR, Estimated: >60 mL/min (ref >60)
Glucose, Bld: 113 mg/dL — ABNORMAL HIGH (ref 70–99)
Potassium: 3.9 mmol/L (ref 3.5–5.1)
Sodium: 137 mmol/L (ref 135–145)
Total Bilirubin: 1.1 mg/dL (ref 0.3–1.2)
Total Protein: 7.1 g/dL (ref 6.5–8.1)

## 2020-12-22 LAB — LACTIC ACID, PLASMA: Lactic Acid, Venous: 1.2 mmol/L (ref 0.5–1.9)

## 2020-12-22 MED ORDER — IBUPROFEN 800 MG PO TABS: 800.0000 mg | ORAL_TABLET | Freq: Three times a day (TID) | ORAL | 0 refills | Status: DC | PRN

## 2020-12-22 MED ORDER — IBUPROFEN 800 MG PO TABS
800.0000 mg | ORAL_TABLET | Freq: Once | ORAL | Status: AC
  Administered 2020-12-22: 800 mg via ORAL
  Filled 2020-12-22: qty 1

## 2020-12-22 MED ORDER — CEPHALEXIN 500 MG PO CAPS: 500.0000 mg | ORAL_CAPSULE | Freq: Three times a day (TID) | ORAL | 0 refills | Status: AC

## 2020-12-22 MED ORDER — SODIUM CHLORIDE 0.9 % IV SOLN
1.0000 g | Freq: Once | INTRAVENOUS | Status: AC
  Administered 2020-12-22: 1 g via INTRAVENOUS
  Filled 2020-12-22: qty 10

## 2020-12-22 MED ORDER — TRAMADOL HCL 50 MG PO TABS
50.0000 mg | ORAL_TABLET | Freq: Once | ORAL | Status: AC
  Administered 2020-12-22: 50 mg via ORAL
  Filled 2020-12-22: qty 1

## 2020-12-22 MED ORDER — TETANUS-DIPHTH-ACELL PERTUSSIS 5-2.5-18.5 LF-MCG/0.5 IM SUSY
0.5000 mL | PREFILLED_SYRINGE | Freq: Once | INTRAMUSCULAR | Status: AC
  Administered 2020-12-22: 0.5 mL via INTRAMUSCULAR
  Filled 2020-12-22: qty 0.5

## 2020-12-22 NOTE — Discharge Instructions (Addendum)

## 2020-12-22 NOTE — ED Triage Notes (Signed)
EMS brings pt in from home for c/o rt foot pain/swelling x wk; st unsure of injury; pt st pain radiates into lower leg;' redness noted
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
## 2020-12-22 NOTE — ED Notes (Signed)
MD Don Perking notified: patient requesting pain med "something stronger than ibuprofen"

## 2022-02-09 HISTORY — PX: FOOT SURGERY: SHX648

## 2022-03-09 DIAGNOSIS — W268XXA Contact with other sharp object(s), not elsewhere classified, initial encounter: Secondary | ICD-10-CM

## 2022-03-09 DIAGNOSIS — R7989 Other specified abnormal findings of blood chemistry: Secondary | ICD-10-CM | POA: Diagnosis present

## 2022-03-09 DIAGNOSIS — L03115 Cellulitis of right lower limb: Principal | ICD-10-CM | POA: Diagnosis present

## 2022-03-09 DIAGNOSIS — Z5329 Procedure and treatment not carried out because of patient's decision for other reasons: Secondary | ICD-10-CM | POA: Diagnosis not present

## 2022-03-09 DIAGNOSIS — F1190 Opioid use, unspecified, uncomplicated: Secondary | ICD-10-CM | POA: Diagnosis present

## 2022-03-09 DIAGNOSIS — Z800 Family history of malignant neoplasm of digestive organs: Secondary | ICD-10-CM

## 2022-03-09 DIAGNOSIS — S91331A Puncture wound without foreign body, right foot, initial encounter: Secondary | ICD-10-CM | POA: Diagnosis present

## 2022-03-09 DIAGNOSIS — Z23 Encounter for immunization: Secondary | ICD-10-CM

## 2022-03-09 DIAGNOSIS — F1510 Other stimulant abuse, uncomplicated: Secondary | ICD-10-CM | POA: Diagnosis present

## 2022-03-09 DIAGNOSIS — F17210 Nicotine dependence, cigarettes, uncomplicated: Secondary | ICD-10-CM | POA: Diagnosis present

## 2022-03-10 ENCOUNTER — Telehealth: Payer: Self-pay | Admitting: Pharmacist

## 2022-03-10 ENCOUNTER — Emergency Department: Payer: Self-pay

## 2022-03-10 ENCOUNTER — Inpatient Hospital Stay: Payer: Self-pay

## 2022-03-10 ENCOUNTER — Encounter: Payer: Self-pay | Admitting: Emergency Medicine

## 2022-03-10 ENCOUNTER — Inpatient Hospital Stay
Admission: EM | Admit: 2022-03-10 | Discharge: 2022-03-10 | DRG: 603 | Discharge status: Against Medical Advice | Payer: Self-pay | Attending: Internal Medicine | Admitting: Internal Medicine

## 2022-03-10 DIAGNOSIS — L03115 Cellulitis of right lower limb: Secondary | ICD-10-CM

## 2022-03-10 DIAGNOSIS — L0390 Cellulitis, unspecified: Secondary | ICD-10-CM | POA: Diagnosis present

## 2022-03-10 DIAGNOSIS — Z720 Tobacco use: Secondary | ICD-10-CM | POA: Insufficient documentation

## 2022-03-10 DIAGNOSIS — R7989 Other specified abnormal findings of blood chemistry: Secondary | ICD-10-CM | POA: Insufficient documentation

## 2022-03-10 DIAGNOSIS — F1190 Opioid use, unspecified, uncomplicated: Secondary | ICD-10-CM | POA: Insufficient documentation

## 2022-03-10 DIAGNOSIS — F1911 Other psychoactive substance abuse, in remission: Secondary | ICD-10-CM

## 2022-03-10 DIAGNOSIS — F1510 Other stimulant abuse, uncomplicated: Secondary | ICD-10-CM | POA: Diagnosis present

## 2022-03-10 DIAGNOSIS — Z716 Tobacco abuse counseling: Secondary | ICD-10-CM

## 2022-03-10 DIAGNOSIS — L089 Local infection of the skin and subcutaneous tissue, unspecified: Principal | ICD-10-CM

## 2022-03-10 HISTORY — DX: Other stimulant abuse, uncomplicated: F15.10

## 2022-03-10 HISTORY — DX: Opioid use, unspecified, uncomplicated: F11.90

## 2022-03-10 LAB — BLOOD CULTURE ID PANEL (REFLEXED) - BCID2

## 2022-03-10 LAB — CBC WITH DIFFERENTIAL/PLATELET
Abs Immature Granulocytes: 0.01 10*3/uL (ref 0.00–0.07)
Basophils Absolute: 0 10*3/uL (ref 0.0–0.1)
Basophils Relative: 1 %
Eosinophils Absolute: 0.2 10*3/uL (ref 0.0–0.5)
Eosinophils Relative: 2 %
HCT: 43.6 % (ref 39.0–52.0)
Hemoglobin: 14.2 g/dL (ref 13.0–17.0)
Immature Granulocytes: 0 %
Lymphocytes Relative: 22 %
Lymphs Abs: 1.8 10*3/uL (ref 0.7–4.0)
MCH: 29.2 pg (ref 26.0–34.0)
MCHC: 32.6 g/dL (ref 30.0–36.0)
MCV: 89.7 fL (ref 80.0–100.0)
Monocytes Absolute: 0.8 10*3/uL (ref 0.1–1.0)
Monocytes Relative: 10 %
Neutro Abs: 5.3 10*3/uL (ref 1.7–7.7)
Neutrophils Relative %: 65 %
Platelets: 267 10*3/uL (ref 150–400)
RBC: 4.86 MIL/uL (ref 4.22–5.81)
RDW: 13.2 % (ref 11.5–15.5)
WBC: 8.1 10*3/uL (ref 4.0–10.5)
nRBC: 0 % (ref 0.0–0.2)

## 2022-03-10 LAB — COMPREHENSIVE METABOLIC PANEL
ALT: 67 U/L — ABNORMAL HIGH (ref 0–44)
ALT: 76 U/L — ABNORMAL HIGH (ref 0–44)
AST: 46 U/L — ABNORMAL HIGH (ref 15–41)
AST: 51 U/L — ABNORMAL HIGH (ref 15–41)
Albumin: 3.3 g/dL — ABNORMAL LOW (ref 3.5–5.0)
Albumin: 3.7 g/dL (ref 3.5–5.0)
Alkaline Phosphatase: 66 U/L (ref 38–126)
Alkaline Phosphatase: 78 U/L (ref 38–126)
Anion gap: 5 (ref 5–15)
Anion gap: 6 (ref 5–15)
BUN: 14 mg/dL (ref 6–20)
BUN: 15 mg/dL (ref 6–20)
CO2: 27 mmol/L (ref 22–32)
CO2: 28 mmol/L (ref 22–32)
Calcium: 8.6 mg/dL — ABNORMAL LOW (ref 8.9–10.3)
Calcium: 8.6 mg/dL — ABNORMAL LOW (ref 8.9–10.3)
Chloride: 104 mmol/L (ref 98–111)
Chloride: 106 mmol/L (ref 98–111)
Creatinine, Ser: 0.81 mg/dL (ref 0.61–1.24)
Creatinine, Ser: 1.06 mg/dL (ref 0.61–1.24)
GFR, Estimated: >60 mL/min (ref >60)
GFR, Estimated: >60 mL/min (ref >60)
Glucose, Bld: 109 mg/dL — ABNORMAL HIGH (ref 70–99)
Glucose, Bld: 89 mg/dL (ref 70–99)
Potassium: 3.6 mmol/L (ref 3.5–5.1)
Potassium: 3.7 mmol/L (ref 3.5–5.1)
Sodium: 138 mmol/L (ref 135–145)
Sodium: 138 mmol/L (ref 135–145)
Total Bilirubin: 0.6 mg/dL (ref 0.3–1.2)
Total Bilirubin: 0.7 mg/dL (ref 0.3–1.2)
Total Protein: 6.8 g/dL (ref 6.5–8.1)
Total Protein: 7.7 g/dL (ref 6.5–8.1)

## 2022-03-10 LAB — LACTIC ACID, PLASMA: Lactic Acid, Venous: 1.4 mmol/L (ref 0.5–1.9)

## 2022-03-10 LAB — CBC
HCT: 39.8 % (ref 39.0–52.0)
Hemoglobin: 13.2 g/dL (ref 13.0–17.0)
MCH: 29.3 pg (ref 26.0–34.0)
MCHC: 33.2 g/dL (ref 30.0–36.0)
MCV: 88.2 fL (ref 80.0–100.0)
Platelets: 250 10*3/uL (ref 150–400)
RBC: 4.51 MIL/uL (ref 4.22–5.81)
RDW: 13.4 % (ref 11.5–15.5)
WBC: 6.6 10*3/uL (ref 4.0–10.5)
nRBC: 0 % (ref 0.0–0.2)

## 2022-03-10 LAB — SEDIMENTATION RATE: Sed Rate: 31 mm/hr — ABNORMAL HIGH (ref 0–20)

## 2022-03-10 MED ORDER — OXYCODONE-ACETAMINOPHEN 7.5-325 MG PO TABS: 1.0000 | ORAL_TABLET | Freq: Four times a day (QID) | ORAL | Status: DC | PRN

## 2022-03-10 MED ORDER — IOHEXOL 300 MG/ML  SOLN
100.0000 mL | Freq: Once | INTRAMUSCULAR | Status: AC | PRN
  Administered 2022-03-10: 100 mL via INTRAVENOUS

## 2022-03-10 MED ORDER — ACETAMINOPHEN 325 MG RE SUPP: 650.0000 mg | Freq: Four times a day (QID) | RECTAL | Status: DC | PRN

## 2022-03-10 MED ORDER — ONDANSETRON HCL 4 MG PO TABS: 4.0000 mg | ORAL_TABLET | Freq: Four times a day (QID) | ORAL | Status: DC | PRN

## 2022-03-10 MED ORDER — ACETAMINOPHEN 325 MG PO TABS: 650.0000 mg | ORAL_TABLET | Freq: Four times a day (QID) | ORAL | Status: DC | PRN

## 2022-03-10 MED ORDER — PIPERACILLIN-TAZOBACTAM 4.5 G IVPB
4.5000 g | Freq: Once | INTRAVENOUS | Status: AC
  Administered 2022-03-10: 4.5 g via INTRAVENOUS
  Filled 2022-03-10: qty 100

## 2022-03-10 MED ORDER — TETANUS-DIPHTH-ACELL PERTUSSIS 5-2.5-18.5 LF-MCG/0.5 IM SUSY
0.5000 mL | PREFILLED_SYRINGE | Freq: Once | INTRAMUSCULAR | Status: AC
  Administered 2022-03-10: 0.5 mL via INTRAMUSCULAR
  Filled 2022-03-10: qty 0.5

## 2022-03-10 MED ORDER — SODIUM CHLORIDE 0.9 % IV SOLN: 2.0000 g | Freq: Three times a day (TID) | INTRAVENOUS | Status: DC

## 2022-03-10 MED ORDER — OXYCODONE-ACETAMINOPHEN 5-325 MG PO TABS
2.0000 | ORAL_TABLET | Freq: Once | ORAL | Status: AC
  Administered 2022-03-10: 2 via ORAL
  Filled 2022-03-10: qty 2

## 2022-03-10 MED ORDER — VANCOMYCIN HCL 2000 MG/400ML IV SOLN
2000.0000 mg | Freq: Once | INTRAVENOUS | Status: AC
  Administered 2022-03-10: 2000 mg via INTRAVENOUS
  Filled 2022-03-10: qty 400

## 2022-03-10 MED ORDER — ONDANSETRON HCL 4 MG/2ML IJ SOLN: 4.0000 mg | Freq: Four times a day (QID) | INTRAMUSCULAR | Status: DC | PRN

## 2022-03-10 MED ORDER — ENOXAPARIN SODIUM 40 MG/0.4ML IJ SOSY: 40.0000 mg | PREFILLED_SYRINGE | INTRAMUSCULAR | Status: DC

## 2022-03-10 MED ORDER — VANCOMYCIN HCL 1250 MG/250ML IV SOLN: 1250.0000 mg | Freq: Two times a day (BID) | INTRAVENOUS | Status: DC
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
## 2022-03-10 NOTE — Progress Notes (Addendum)
Pharmacy  - Antimicrobial Stewardship - request to return to ED for MSSA bacteremia  Patient presented early 11/30 with foot wound after stepping on candlestick holder.  Blood culture collected and has GPC growing with BCID detected MSSA.    Called number listed in EPIC,  sister answered phone. He no longer lives with her but sees him periodically.  She is unsure when may see again.  I was non-descript regarding reason but asked her to have him come to ED when she sees him again.  She asked if it was due to his foot and I said yes.  She is to inform him to return back to ED  If returns back to ED, since MSSA detected in blood culture, suggest starting cefazolin 2gm IV q8h  Juliette Alcide, PharmD, BCPS, BCIDP Work Cell: [phone] 03/10/2022 4:22 PM

## 2022-03-10 NOTE — Assessment & Plan Note (Signed)
Check hepatitis profile and HIV antibody.

## 2022-03-10 NOTE — Assessment & Plan Note (Addendum)
Cellulitis of the top of right foot with a puncture wound on the bottom of the foot.  ER physician ordered MRI.  We will continue antibiotics vancomycin and cefepime.  Add on ESR and CRP to labs already drawn.  Pain control with Percocet.  ER physician ordered a Tdap booster
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
## 2022-03-10 NOTE — Discharge Instructions (Signed)
As we discussed, you are leaving AGAINST MEDICAL ADVICE.  You can return at any time for another evaluation.  The outcome may not be the same and you are not guaranteed to be admitted if you come back, but the concern right now is that if you leave, the infection in your foot may spread to your leg and could potentially get much worse, putting your health and even your life at risk.  At a minimum, please fill the prescription provided for both of the antibiotics and take the full 10-day course of both medications.  Consider calling the office of Dr. Lilian Kapur in scheduling an appointment with him or one of his colleagues in the podiatry (foot specialty) clinic.

## 2022-03-10 NOTE — Progress Notes (Signed)
PHARMACY - PHYSICIAN COMMUNICATION CRITICAL VALUE ALERT - BLOOD CULTURE IDENTIFICATION (BCID)  Erik Barker is an 51 y.o. male who presented to [facility] on 03/10/2022 with a chief complaint of foot pain after stepping on candle holder  Assessment:  Patient left AMA 11/30, blood cultures from 11/30 with GPC in 1/4 bottles, BCID detects MSSA.  Using contact number in EPIC, called and spoke to sister who sees patient periodically.  I told her it was important for patient to return to ED.    Current antibiotics: He received one dose of vancomycin and piperacillin/tazobactam prior to leavin AMA  Changes to prescribed antibiotics recommended:  Await return back to ED - recommend starting cefazolin 2gm IV q8h for MSSA bacteremia  Results for orders placed or performed during the hospital encounter of 03/10/22  Blood Culture ID Panel (Reflexed) (Collected: 03/10/2022 12:08 AM)  Result Value Ref Range   Enterococcus faecalis NOT DETECTED NOT DETECTED   Enterococcus Faecium NOT DETECTED NOT DETECTED   Listeria monocytogenes NOT DETECTED NOT DETECTED   Staphylococcus species DETECTED (A) NOT DETECTED   Staphylococcus aureus (BCID) DETECTED (A) NOT DETECTED   Staphylococcus epidermidis NOT DETECTED NOT DETECTED   Staphylococcus lugdunensis NOT DETECTED NOT DETECTED   Streptococcus species NOT DETECTED NOT DETECTED   Streptococcus agalactiae NOT DETECTED NOT DETECTED   Streptococcus pneumoniae NOT DETECTED NOT DETECTED   Streptococcus pyogenes NOT DETECTED NOT DETECTED   A.calcoaceticus-baumannii NOT DETECTED NOT DETECTED   Bacteroides fragilis NOT DETECTED NOT DETECTED   Enterobacterales NOT DETECTED NOT DETECTED   Enterobacter cloacae complex NOT DETECTED NOT DETECTED   Escherichia coli NOT DETECTED NOT DETECTED   Klebsiella aerogenes NOT DETECTED NOT DETECTED   Klebsiella oxytoca NOT DETECTED NOT DETECTED   Klebsiella pneumoniae NOT DETECTED NOT DETECTED   Proteus species NOT DETECTED  NOT DETECTED   Salmonella species NOT DETECTED NOT DETECTED   Serratia marcescens NOT DETECTED NOT DETECTED   Haemophilus influenzae NOT DETECTED NOT DETECTED   Neisseria meningitidis NOT DETECTED NOT DETECTED   Pseudomonas aeruginosa NOT DETECTED NOT DETECTED   Stenotrophomonas maltophilia NOT DETECTED NOT DETECTED   Candida albicans NOT DETECTED NOT DETECTED   Candida auris NOT DETECTED NOT DETECTED   Candida glabrata NOT DETECTED NOT DETECTED   Candida krusei NOT DETECTED NOT DETECTED   Candida parapsilosis NOT DETECTED NOT DETECTED   Candida tropicalis NOT DETECTED NOT DETECTED   Cryptococcus neoformans/gattii NOT DETECTED NOT DETECTED   Meth resistant mecA/C and MREJ NOT DETECTED NOT DETECTED   Juliette Alcide, PharmD, BCPS, BCIDP Work Cell: [phone] 03/10/2022 3:16 PM

## 2022-03-10 NOTE — Assessment & Plan Note (Signed)
Smoking cessation counseling done by me.  Nicotine patch ordered.  3 minutes.
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
## 2022-03-10 NOTE — ED Notes (Signed)
Pt taken to CT.

## 2022-03-10 NOTE — Assessment & Plan Note (Signed)
Advised to discontinue heroin

## 2022-03-10 NOTE — Progress Notes (Signed)
Pharmacy Antibiotic Note  Erik Barker is a 51 y.o. male admitted on 03/10/2022 with cellulitis.  Pharmacy has been consulted for Cefepime & Vancomycin dosing for 7 days.  Plan: Cefepime 2 gm q8hr per indication & renal fxn.  Pt given Vancomycin 2000 mg once. Vancomycin 1250 mg IV Q 12 hrs. Goal AUC 400-550. Expected AUC: 485.1 SCr used: 1.06  Pharmacy will continue to follow and will adjust abx dosing whenever warranted.  Temp (24hrs), Avg:98.2 F (36.8 C), Min:98.1 F (36.7 C), Max:98.2 F (36.8 C)   Recent Labs  Lab 03/10/22 0007 03/10/22 0008  WBC 8.1  --   CREATININE 1.06  --   LATICACIDVEN  --  1.4    Estimated Creatinine Clearance: 95.2 mL/min (by C-G formula based on SCr of 1.06 mg/dL).    No Known Allergies  Antimicrobials this admission: 11/30 Zosyn >> x 1 dose 11/30 Cefepime >> x 7 days 11/30 Vancomycin >> x 7 days  Microbiology results: 11/30 BCx: Pending  Thank you for allowing pharmacy to be a part of this patient's care.  Otelia Sergeant, PharmD, [facility] 03/10/2022 4:21 AM

## 2022-03-10 NOTE — ED Notes (Addendum)
Pt insistent on leaving. Pt was educated by nurse and ER doc, York Cerise, about importance of staying and getting treatment. Pt still wants to leave. York Cerise MD spoke with Renae Gloss MD, admitting doctor, about pt leaving AMA

## 2022-03-10 NOTE — Assessment & Plan Note (Signed)
Advised to discontinue methamphetamine

## 2022-03-10 NOTE — Progress Notes (Signed)
PHARMACY -  BRIEF ANTIBIOTIC NOTE   Pharmacy has received consult(s) for Vancomycin from an ED provider.  The patient's profile has been reviewed for ht/wt/allergies/indication/available labs.    One time order(s) placed for Vancomycin 2 gm per pt wt: 93.6 kg  Further antibiotics/pharmacy consults should be ordered by admitting physician if indicated.                       Thank you, Otelia Sergeant, PharmD, [facility] 03/10/2022 3:17 AM

## 2022-03-10 NOTE — ED Triage Notes (Signed)
Pt presents via POV with complaints of right foot pain after stepping on a candle holder 6 days ago. Pts foot is warm to touch and erythematous with mild pus on the bottom of his foot. Denies fevers, chills, CP or SOB.

## 2022-03-10 NOTE — ED Notes (Addendum)
Wieting MD at bedside 

## 2022-03-12 LAB — CULTURE, BLOOD (SINGLE): Special Requests: ADEQUATE

## 2022-03-13 LAB — CULTURE, BLOOD (SINGLE)

## 2022-03-27 ENCOUNTER — Emergency Department: Payer: Self-pay

## 2022-03-27 ENCOUNTER — Other Ambulatory Visit: Payer: Self-pay

## 2022-03-27 DIAGNOSIS — Z5329 Procedure and treatment not carried out because of patient's decision for other reasons: Secondary | ICD-10-CM | POA: Insufficient documentation

## 2022-03-27 DIAGNOSIS — F1990 Other psychoactive substance use, unspecified, uncomplicated: Secondary | ICD-10-CM | POA: Insufficient documentation

## 2022-03-27 DIAGNOSIS — L03115 Cellulitis of right lower limb: Secondary | ICD-10-CM | POA: Insufficient documentation

## 2022-03-27 DIAGNOSIS — Z7289 Other problems related to lifestyle: Secondary | ICD-10-CM | POA: Insufficient documentation

## 2022-03-27 LAB — COMPREHENSIVE METABOLIC PANEL
ALT: 62 U/L — ABNORMAL HIGH (ref 0–44)
AST: 66 U/L — ABNORMAL HIGH (ref 15–41)
Albumin: 3.6 g/dL (ref 3.5–5.0)
Alkaline Phosphatase: 74 U/L (ref 38–126)
Anion gap: 9 (ref 5–15)
BUN: 23 mg/dL — ABNORMAL HIGH (ref 6–20)
CO2: 28 mmol/L (ref 22–32)
Calcium: 9.3 mg/dL (ref 8.9–10.3)
Chloride: 102 mmol/L (ref 98–111)
Creatinine, Ser: 1.01 mg/dL (ref 0.61–1.24)
GFR, Estimated: >60 mL/min (ref >60)
Glucose, Bld: 124 mg/dL — ABNORMAL HIGH (ref 70–99)
Potassium: 3.5 mmol/L (ref 3.5–5.1)
Sodium: 139 mmol/L (ref 135–145)
Total Bilirubin: 1 mg/dL (ref 0.3–1.2)
Total Protein: 8.6 g/dL — ABNORMAL HIGH (ref 6.5–8.1)

## 2022-03-27 NOTE — ED Triage Notes (Signed)
Pt to ED from home. Pt was seen 11/30 for same. Foot is now better. Pt advised he refused to stay for admission last visit. Pt advised he lost the prescription on 11/30 so he never picked it up. Pt is CAOx4 and in no acute signs of distress. Pt is currently eating [employer] food. Pt right foot is swollen. Pt does have good pulses in his foot.

## 2022-03-27 NOTE — ED Notes (Signed)
This RN collected first set of cultures and a red top and blue top as well and sent to lab to hold.

## 2022-03-28 ENCOUNTER — Emergency Department
Admission: EM | Admit: 2022-03-28 | Discharge: 2022-03-28 | Discharge status: Against Medical Advice | Payer: Self-pay | Attending: Emergency Medicine | Admitting: Emergency Medicine

## 2022-03-28 DIAGNOSIS — F1990 Other psychoactive substance use, unspecified, uncomplicated: Secondary | ICD-10-CM

## 2022-03-28 DIAGNOSIS — Z765 Malingerer [conscious simulation]: Secondary | ICD-10-CM

## 2022-03-28 DIAGNOSIS — L03115 Cellulitis of right lower limb: Secondary | ICD-10-CM

## 2022-03-28 LAB — CBC WITH DIFFERENTIAL/PLATELET
Abs Immature Granulocytes: 0.01 10*3/uL (ref 0.00–0.07)
Basophils Absolute: 0 10*3/uL (ref 0.0–0.1)
Basophils Relative: 1 %
Eosinophils Absolute: 0.1 10*3/uL (ref 0.0–0.5)
Eosinophils Relative: 2 %
HCT: 43.3 % (ref 39.0–52.0)
Hemoglobin: 13.6 g/dL (ref 13.0–17.0)
Immature Granulocytes: 0 %
Lymphocytes Relative: 23 %
Lymphs Abs: 1.3 10*3/uL (ref 0.7–4.0)
MCH: 28.4 pg (ref 26.0–34.0)
MCHC: 31.4 g/dL (ref 30.0–36.0)
MCV: 90.4 fL (ref 80.0–100.0)
Monocytes Absolute: 0.5 10*3/uL (ref 0.1–1.0)
Monocytes Relative: 8 %
Neutro Abs: 3.9 10*3/uL (ref 1.7–7.7)
Neutrophils Relative %: 66 %
Platelets: 374 10*3/uL (ref 150–400)
RBC: 4.79 MIL/uL (ref 4.22–5.81)
RDW: 13.6 % (ref 11.5–15.5)
WBC: 5.8 10*3/uL (ref 4.0–10.5)
nRBC: 0 % (ref 0.0–0.2)

## 2022-03-28 LAB — URINALYSIS, ROUTINE W REFLEX MICROSCOPIC
Bilirubin Urine: NEGATIVE
Glucose, UA: NEGATIVE mg/dL
Hgb urine dipstick: NEGATIVE
Ketones, ur: NEGATIVE mg/dL
Leukocytes,Ua: NEGATIVE
Nitrite: NEGATIVE
Protein, ur: NEGATIVE mg/dL
Specific Gravity, Urine: 1.017 (ref 1.005–1.030)
pH: 5 (ref 5.0–8.0)

## 2022-03-28 LAB — LACTIC ACID, PLASMA: Lactic Acid, Venous: 2.1 mmol/L (ref 0.5–1.9)

## 2022-03-28 MED ORDER — CEPHALEXIN 500 MG PO CAPS: 500.0000 mg | ORAL_CAPSULE | Freq: Four times a day (QID) | ORAL | 0 refills | Status: DC

## 2022-03-28 MED ORDER — IBUPROFEN 800 MG PO TABS: 800.0000 mg | ORAL_TABLET | Freq: Three times a day (TID) | ORAL | 0 refills | Status: DC | PRN

## 2022-03-28 MED ORDER — SULFAMETHOXAZOLE-TRIMETHOPRIM 800-160 MG PO TABS: 1.0000 | ORAL_TABLET | Freq: Two times a day (BID) | ORAL | 0 refills | Status: DC

## 2022-03-28 MED ORDER — KETOROLAC TROMETHAMINE 30 MG/ML IJ SOLN
60.0000 mg | Freq: Once | INTRAMUSCULAR | Status: DC
  Filled 2022-03-28: qty 2

## 2022-03-28 MED ORDER — CEPHALEXIN 500 MG PO CAPS
500.0000 mg | ORAL_CAPSULE | Freq: Once | ORAL | Status: AC
  Administered 2022-03-28: 500 mg via ORAL
  Filled 2022-03-28: qty 1

## 2022-03-28 MED ORDER — SULFAMETHOXAZOLE-TRIMETHOPRIM 800-160 MG PO TABS
1.0000 | ORAL_TABLET | Freq: Once | ORAL | Status: AC
  Administered 2022-03-28: 1 via ORAL
  Filled 2022-03-28: qty 1
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
## 2022-03-28 NOTE — ED Notes (Signed)
Patient tied cord around safety brake on wheelchair. Patient informed that the brake is there for his safety. Patient verbalized understanding.

## 2022-03-28 NOTE — Discharge Instructions (Signed)
We have again recommended admission to the hospital for IV antibiotics given your foot appears significantly infected.  I have recommended further blood work and I think you will need another CT of your foot.  You have declined all of this stating that he wanted to leave AGAINST MEDICAL ADVICE with oral antibiotics.  We discussed that without further IV medications and workup in the emergency department that this could lead to worsening symptoms, loss of your foot or leg, severe and permanent disability and even death.

## 2022-03-29 ENCOUNTER — Encounter: Payer: Self-pay | Admitting: *Deleted

## 2022-03-29 ENCOUNTER — Other Ambulatory Visit: Payer: Self-pay

## 2022-03-29 DIAGNOSIS — M86671 Other chronic osteomyelitis, right ankle and foot: Secondary | ICD-10-CM | POA: Diagnosis present

## 2022-03-29 DIAGNOSIS — F1110 Opioid abuse, uncomplicated: Secondary | ICD-10-CM | POA: Diagnosis present

## 2022-03-29 DIAGNOSIS — Z91199 Patient's noncompliance with other medical treatment and regimen due to unspecified reason: Secondary | ICD-10-CM

## 2022-03-29 DIAGNOSIS — B9689 Other specified bacterial agents as the cause of diseases classified elsewhere: Secondary | ICD-10-CM | POA: Diagnosis present

## 2022-03-29 DIAGNOSIS — Z5329 Procedure and treatment not carried out because of patient's decision for other reasons: Secondary | ICD-10-CM | POA: Diagnosis present

## 2022-03-29 DIAGNOSIS — M86171 Other acute osteomyelitis, right ankle and foot: Secondary | ICD-10-CM | POA: Diagnosis present

## 2022-03-29 DIAGNOSIS — Z1152 Encounter for screening for COVID-19: Secondary | ICD-10-CM

## 2022-03-29 DIAGNOSIS — E871 Hypo-osmolality and hyponatremia: Secondary | ICD-10-CM | POA: Diagnosis not present

## 2022-03-29 DIAGNOSIS — M009 Pyogenic arthritis, unspecified: Secondary | ICD-10-CM | POA: Diagnosis present

## 2022-03-29 DIAGNOSIS — F17290 Nicotine dependence, other tobacco product, uncomplicated: Secondary | ICD-10-CM | POA: Diagnosis present

## 2022-03-29 DIAGNOSIS — W228XXA Striking against or struck by other objects, initial encounter: Secondary | ICD-10-CM | POA: Diagnosis present

## 2022-03-29 DIAGNOSIS — L089 Local infection of the skin and subcutaneous tissue, unspecified: Secondary | ICD-10-CM | POA: Diagnosis present

## 2022-03-29 DIAGNOSIS — S91341A Puncture wound with foreign body, right foot, initial encounter: Principal | ICD-10-CM | POA: Diagnosis present

## 2022-03-29 DIAGNOSIS — B1920 Unspecified viral hepatitis C without hepatic coma: Secondary | ICD-10-CM | POA: Diagnosis present

## 2022-03-29 DIAGNOSIS — Y92009 Unspecified place in unspecified non-institutional (private) residence as the place of occurrence of the external cause: Secondary | ICD-10-CM

## 2022-03-29 DIAGNOSIS — M19071 Primary osteoarthritis, right ankle and foot: Secondary | ICD-10-CM | POA: Diagnosis present

## 2022-03-29 DIAGNOSIS — B9561 Methicillin susceptible Staphylococcus aureus infection as the cause of diseases classified elsewhere: Secondary | ICD-10-CM | POA: Diagnosis present

## 2022-03-29 DIAGNOSIS — F17210 Nicotine dependence, cigarettes, uncomplicated: Secondary | ICD-10-CM | POA: Diagnosis present

## 2022-03-29 DIAGNOSIS — L97509 Non-pressure chronic ulcer of other part of unspecified foot with unspecified severity: Secondary | ICD-10-CM | POA: Diagnosis present

## 2022-03-29 DIAGNOSIS — R7881 Bacteremia: Secondary | ICD-10-CM | POA: Diagnosis present

## 2022-03-29 DIAGNOSIS — F1590 Other stimulant use, unspecified, uncomplicated: Secondary | ICD-10-CM | POA: Diagnosis present
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
## 2022-03-30 ENCOUNTER — Emergency Department: Payer: Self-pay

## 2022-03-30 ENCOUNTER — Inpatient Hospital Stay
Admission: EM | Admit: 2022-03-30 | Discharge: 2022-04-04 | DRG: 902 | Discharge status: Against Medical Advice | Payer: Self-pay | Attending: Internal Medicine | Admitting: Internal Medicine

## 2022-03-30 DIAGNOSIS — M009 Pyogenic arthritis, unspecified: Secondary | ICD-10-CM | POA: Diagnosis present

## 2022-03-30 DIAGNOSIS — B9561 Methicillin susceptible Staphylococcus aureus infection as the cause of diseases classified elsewhere: Secondary | ICD-10-CM

## 2022-03-30 DIAGNOSIS — M79671 Pain in right foot: Principal | ICD-10-CM

## 2022-03-30 DIAGNOSIS — Z720 Tobacco use: Secondary | ICD-10-CM | POA: Diagnosis present

## 2022-03-30 DIAGNOSIS — R7881 Bacteremia: Secondary | ICD-10-CM

## 2022-03-30 DIAGNOSIS — M869 Osteomyelitis, unspecified: Secondary | ICD-10-CM

## 2022-03-30 DIAGNOSIS — E871 Hypo-osmolality and hyponatremia: Secondary | ICD-10-CM | POA: Insufficient documentation

## 2022-03-30 DIAGNOSIS — B1920 Unspecified viral hepatitis C without hepatic coma: Secondary | ICD-10-CM | POA: Insufficient documentation

## 2022-03-30 DIAGNOSIS — M86171 Other acute osteomyelitis, right ankle and foot: Secondary | ICD-10-CM

## 2022-03-30 DIAGNOSIS — F1910 Other psychoactive substance abuse, uncomplicated: Secondary | ICD-10-CM | POA: Insufficient documentation

## 2022-03-30 LAB — CBC WITH DIFFERENTIAL/PLATELET
Abs Immature Granulocytes: 0.01 10*3/uL (ref 0.00–0.07)
Basophils Absolute: 0 10*3/uL (ref 0.0–0.1)
Basophils Relative: 0 %
Eosinophils Absolute: 0.2 10*3/uL (ref 0.0–0.5)
Eosinophils Relative: 3 %
HCT: 39.5 % (ref 39.0–52.0)
Hemoglobin: 12.8 g/dL — ABNORMAL LOW (ref 13.0–17.0)
Immature Granulocytes: 0 %
Lymphocytes Relative: 27 %
Lymphs Abs: 1.8 10*3/uL (ref 0.7–4.0)
MCH: 28.7 pg (ref 26.0–34.0)
MCHC: 32.4 g/dL (ref 30.0–36.0)
MCV: 88.6 fL (ref 80.0–100.0)
Monocytes Absolute: 0.5 10*3/uL (ref 0.1–1.0)
Monocytes Relative: 7 %
Neutro Abs: 4.2 10*3/uL (ref 1.7–7.7)
Neutrophils Relative %: 63 %
Platelets: 310 10*3/uL (ref 150–400)
RBC: 4.46 MIL/uL (ref 4.22–5.81)
RDW: 13.5 % (ref 11.5–15.5)
WBC: 6.8 10*3/uL (ref 4.0–10.5)
nRBC: 0 % (ref 0.0–0.2)

## 2022-03-30 LAB — BASIC METABOLIC PANEL
Anion gap: 6 (ref 5–15)
BUN: 16 mg/dL (ref 6–20)
CO2: 25 mmol/L (ref 22–32)
Calcium: 8.8 mg/dL — ABNORMAL LOW (ref 8.9–10.3)
Chloride: 107 mmol/L (ref 98–111)
Creatinine, Ser: 0.9 mg/dL (ref 0.61–1.24)
GFR, Estimated: >60 mL/min (ref >60)
Glucose, Bld: 124 mg/dL — ABNORMAL HIGH (ref 70–99)
Potassium: 3.7 mmol/L (ref 3.5–5.1)
Sodium: 138 mmol/L (ref 135–145)

## 2022-03-30 LAB — SEDIMENTATION RATE: Sed Rate: 44 mm/hr — ABNORMAL HIGH (ref 0–20)

## 2022-03-30 MED ORDER — OXYCODONE HCL 5 MG PO TABS
5.0000 mg | ORAL_TABLET | ORAL | Status: DC | PRN
  Administered 2022-03-30 – 2022-04-04 (×15): 5 mg via ORAL
  Filled 2022-03-30 (×14): qty 1

## 2022-03-30 MED ORDER — KETOROLAC TROMETHAMINE 15 MG/ML IJ SOLN
15.0000 mg | Freq: Once | INTRAMUSCULAR | Status: AC
  Administered 2022-03-30: 15 mg via INTRAVENOUS
  Filled 2022-03-30: qty 1

## 2022-03-30 MED ORDER — SODIUM CHLORIDE 0.9 % IV SOLN: INTRAVENOUS | Status: AC

## 2022-03-30 MED ORDER — SODIUM CHLORIDE 0.9 % IV SOLN
2.0000 g | Freq: Once | INTRAVENOUS | Status: AC
  Administered 2022-03-30: 2 g via INTRAVENOUS
  Filled 2022-03-30: qty 12.5

## 2022-03-30 MED ORDER — CEFAZOLIN SODIUM-DEXTROSE 1-4 GM/50ML-% IV SOLN
1.0000 g | Freq: Three times a day (TID) | INTRAVENOUS | Status: DC
  Administered 2022-03-30 (×2): 1 g via INTRAVENOUS
  Filled 2022-03-30 (×7): qty 50

## 2022-03-30 MED ORDER — ORAL CARE MOUTH RINSE: 15.0000 mL | OROMUCOSAL | Status: DC | PRN

## 2022-03-30 MED ORDER — ONDANSETRON HCL 4 MG PO TABS: 4.0000 mg | ORAL_TABLET | Freq: Four times a day (QID) | ORAL | Status: DC | PRN

## 2022-03-30 MED ORDER — METRONIDAZOLE 500 MG PO TABS
500.0000 mg | ORAL_TABLET | Freq: Two times a day (BID) | ORAL | Status: DC
  Administered 2022-03-30 (×2): 500 mg via ORAL
  Filled 2022-03-30 (×2): qty 1

## 2022-03-30 MED ORDER — IOHEXOL 300 MG/ML  SOLN
100.0000 mL | Freq: Once | INTRAMUSCULAR | Status: AC | PRN
  Administered 2022-03-30: 100 mL via INTRAVENOUS

## 2022-03-30 MED ORDER — VANCOMYCIN HCL IN DEXTROSE 1-5 GM/200ML-% IV SOLN
1000.0000 mg | Freq: Once | INTRAVENOUS | Status: AC
  Administered 2022-03-30: 1000 mg via INTRAVENOUS
  Filled 2022-03-30: qty 200

## 2022-03-30 MED ORDER — ONDANSETRON HCL 4 MG/2ML IJ SOLN: 4.0000 mg | Freq: Four times a day (QID) | INTRAMUSCULAR | Status: DC | PRN

## 2022-03-30 MED ORDER — NICOTINE 14 MG/24HR TD PT24
14.0000 mg | MEDICATED_PATCH | Freq: Every day | TRANSDERMAL | Status: DC
  Administered 2022-03-30 – 2022-04-04 (×6): 14 mg via TRANSDERMAL
  Filled 2022-03-30 (×6): qty 1
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
## 2022-03-30 NOTE — Assessment & Plan Note (Addendum)
Patient admits to heroin and methamphetamine use

## 2022-03-30 NOTE — Assessment & Plan Note (Addendum)
Imaging shows TMT joint septic arthritis.  Patient was on IV Ancef while here and will go home on Keflex.

## 2022-03-30 NOTE — Assessment & Plan Note (Addendum)
Osteomyelitis seen on CT scan of the foot.  Wound culture showing rare Staph aureus rare Enterobacter and rare Serratia, the other 2 cultures growing rare Staph aureus.  Continue IV Ancef.  (Of note, the CT scan on 03/10/2022 did not show any signs of osteomyelitis).  Echocardiogram negative.  Walking boot.  Signed out AMA before TEE was done.  Case discussed with ID and will prescribe 4 weeks of high-dose Keflex 1 g 4 times daily.

## 2022-03-30 NOTE — ED Notes (Signed)
Pt ambulatory to restroom independently and back to bed. WCTM.

## 2022-03-30 NOTE — Progress Notes (Signed)
PHARMACY -  BRIEF ANTIBIOTIC NOTE   Pharmacy has received consult(s) for vancomycin and cefepime from an ED provider.  The patient's profile has been reviewed for ht/wt/allergies/indication/available labs.    One time order(s) placed for vancomycin 1,000 mg x 1, cefepime 2 grams x 1  Further antibiotics/pharmacy consults should be ordered by admitting physician if indicated.                       Thank you,  Elliot Gurney, PharmD, BCPS Clinical Pharmacist  03/30/2022 7:59 AM

## 2022-03-30 NOTE — ED Provider Notes (Addendum)
Care assumed of patient from outgoing provider.  See their note for initial history, exam and plan.  Clinical Course as of 03/30/22 0743  Wed Mar 30, 2022  0706 4 weeks of pain and swelling to R foot after stepping on a rod.  Worsening pain yesterday from mild trauma.  Concern for foot infection? No open wounds. CT reassuring will dc home with soft boot.  [SM]    Clinical Course User Index [SM] Corena Herter, MD   CT scan concern for osteomyelitis.  After long conversation with the patient he is willing to stay and understands that he may need a prolonged course of IV antibiotics and possible surgery if IV antibiotics do not improve the infection in his foot.  He understands the risk that if he were to leave and not complete treatment he may end up with an amputation.  Patient has a history of polysubstance abuse, stated that he does not have withdrawal symptoms when he comes into the hospital and that has not been the main issue of why he has left in the past but he felt that he could do it on his own and was "being hard headed".  Understands that this will not heal without antibiotics and is willing to stay in the hospital to be evaluated by podiatry and the hospitalist.  Consulted podiatry and discussed with Dr. Ether Griffins who recommended IV abx and admission to the hospitalist with plans for evaluation by podiatry later today.  Consulted hospitalist for admission.    Corena Herter, MD 03/30/22 1062    Corena Herter, MD 03/30/22 [phone]

## 2022-03-30 NOTE — ED Notes (Signed)
Called CT to find status of foot CT. Per CT Technologist Foot CT hasn't been read and reading room number obtained.

## 2022-03-30 NOTE — ED Notes (Addendum)
Pt has an area of wetness around his iv site, connection between cath and J loop appears to be loose.  Unknown how much cefepime if any pt received, tightened connection, replaced dressing, I flushes with some resistance, d/c pt iv, cath intact, placed iv in L hand, md notified that pt most likely didn't receive all of his cefepime, per md don't redose, she has changed to ancef.

## 2022-03-30 NOTE — Assessment & Plan Note (Signed)
Smoking cessation has been discussed with patient in detail We will place patient on a nicotine transdermal patch 14 mg daily 

## 2022-03-30 NOTE — ED Notes (Signed)
Pt ambulatory to and from the bathroom, pt reports 5/10 foot pain, pt states that he doesn't need anything for pain at this itme.  Resps even and unlabored
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
## 2022-03-30 NOTE — ED Notes (Signed)
Called Reading room and was informed scan would be sent to radiologist for primary read.
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
## 2022-03-31 ENCOUNTER — Encounter: Payer: Self-pay | Admitting: Anesthesiology

## 2022-03-31 ENCOUNTER — Inpatient Hospital Stay
Admit: 2022-03-31 | Discharge: 2022-03-31 | Disposition: A | Discharge status: Home / Self Care | Payer: Self-pay | Attending: Internal Medicine | Admitting: Internal Medicine

## 2022-03-31 DIAGNOSIS — F1910 Other psychoactive substance abuse, uncomplicated: Secondary | ICD-10-CM

## 2022-03-31 DIAGNOSIS — R7881 Bacteremia: Secondary | ICD-10-CM

## 2022-03-31 DIAGNOSIS — Z720 Tobacco use: Secondary | ICD-10-CM

## 2022-03-31 DIAGNOSIS — B9561 Methicillin susceptible Staphylococcus aureus infection as the cause of diseases classified elsewhere: Secondary | ICD-10-CM

## 2022-03-31 LAB — BASIC METABOLIC PANEL
Anion gap: 6 (ref 5–15)
BUN: 12 mg/dL (ref 6–20)
CO2: 25 mmol/L (ref 22–32)
Calcium: 8.3 mg/dL — ABNORMAL LOW (ref 8.9–10.3)
Chloride: 107 mmol/L (ref 98–111)
Creatinine, Ser: 0.87 mg/dL (ref 0.61–1.24)
GFR, Estimated: >60 mL/min (ref >60)
Glucose, Bld: 106 mg/dL — ABNORMAL HIGH (ref 70–99)
Potassium: 3.8 mmol/L (ref 3.5–5.1)
Sodium: 138 mmol/L (ref 135–145)

## 2022-03-31 LAB — ECHOCARDIOGRAM COMPLETE
AV Mean grad: 5 mmHg
AV Peak grad: 9.2 mmHg
Ao pk vel: 1.52 m/s
Area-P 1/2: 3.54 cm2
S' Lateral: 2.5 cm

## 2022-03-31 LAB — URINE DRUG SCREEN, QUALITATIVE (ARMC ONLY)
Amphetamines, Ur Screen: POSITIVE — AB
Barbiturates, Ur Screen: NOT DETECTED
Benzodiazepine, Ur Scrn: NOT DETECTED
Cannabinoid 50 Ng, Ur ~~LOC~~: POSITIVE — AB
Cocaine Metabolite,Ur ~~LOC~~: NOT DETECTED
MDMA (Ecstasy)Ur Screen: NOT DETECTED
Methadone Scn, Ur: NOT DETECTED
Opiate, Ur Screen: NOT DETECTED
Phencyclidine (PCP) Ur S: NOT DETECTED
Tricyclic, Ur Screen: NOT DETECTED

## 2022-03-31 LAB — CBC
HCT: 39.7 % (ref 39.0–52.0)
Hemoglobin: 13.1 g/dL (ref 13.0–17.0)
MCH: 29 pg (ref 26.0–34.0)
MCHC: 33 g/dL (ref 30.0–36.0)
MCV: 88 fL (ref 80.0–100.0)
Platelets: 297 10*3/uL (ref 150–400)
RBC: 4.51 MIL/uL (ref 4.22–5.81)
RDW: 13.7 % (ref 11.5–15.5)
WBC: 5.4 10*3/uL (ref 4.0–10.5)
nRBC: 0 % (ref 0.0–0.2)

## 2022-03-31 LAB — HEPATITIS C ANTIBODY: HCV Ab: REACTIVE — AB

## 2022-03-31 LAB — HIV ANTIBODY (ROUTINE TESTING W REFLEX): HIV Screen 4th Generation wRfx: NONREACTIVE

## 2022-03-31 MED ORDER — CEFAZOLIN SODIUM-DEXTROSE 2-4 GM/100ML-% IV SOLN
2.0000 g | Freq: Three times a day (TID) | INTRAVENOUS | Status: DC
  Administered 2022-03-31 – 2022-04-04 (×14): 2 g via INTRAVENOUS
  Filled 2022-03-31 (×13): qty 100

## 2022-03-31 MED ORDER — MORPHINE SULFATE (PF) 2 MG/ML IV SOLN
2.0000 mg | INTRAVENOUS | Status: DC | PRN
  Administered 2022-03-31 – 2022-04-04 (×13): 2 mg via INTRAVENOUS
  Filled 2022-03-31 (×13): qty 1

## 2022-03-31 MED ORDER — MIDAZOLAM HCL 2 MG/2ML IJ SOLN
INTRAMUSCULAR | Status: AC
  Filled 2022-03-31: qty 2

## 2022-03-31 MED ORDER — FENTANYL CITRATE (PF) 100 MCG/2ML IJ SOLN
INTRAMUSCULAR | Status: AC
  Filled 2022-03-31: qty 2

## 2022-03-31 NOTE — Progress Notes (Signed)
*  PRELIMINARY RESULTS* Echocardiogram 2D Echocardiogram has been performed.  Joanette Gula Kelson Queenan 03/31/2022, 2:59 PM
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
## 2022-03-31 NOTE — Assessment & Plan Note (Addendum)
Blood cultures on 11/30 were positive for MSSA.  The patient signed out of the emergency room AGAINST MEDICAL ADVICE at that time.  The ER physician did give him a prescription for Keflex and Bactrim but the patient did not take them.  Repeat blood cultures negative for 23days.  Echocardiogram did not show any evidence of endocarditis.  Will need TEE.

## 2022-03-31 NOTE — Progress Notes (Signed)
Upon entering pt room pt was observed sleeping with a bag of chips in hand. Woke pt up and asked him when was the last time he had eaten and he stated " Just now I got hungry I didn't eat much" Pt was educated on diet NPO diet order and the risk for aspiration in surgery. Pt stated he understood. Dr. Renae Gloss made aware. Will continue to monitor.

## 2022-04-01 ENCOUNTER — Inpatient Hospital Stay: Payer: Self-pay | Admitting: Anesthesiology

## 2022-04-01 ENCOUNTER — Other Ambulatory Visit: Payer: Self-pay

## 2022-04-01 ENCOUNTER — Encounter
Admission: EM | Discharge status: Against Medical Advice | Payer: Self-pay | Source: Home / Self Care | Attending: Internal Medicine

## 2022-04-01 ENCOUNTER — Encounter: Payer: Self-pay | Admitting: Internal Medicine

## 2022-04-01 DIAGNOSIS — B1920 Unspecified viral hepatitis C without hepatic coma: Secondary | ICD-10-CM | POA: Insufficient documentation

## 2022-04-01 HISTORY — PX: INCISION AND DRAINAGE: SHX5863

## 2022-04-01 HISTORY — PX: BONE BIOPSY: SHX375

## 2022-04-01 LAB — URINE DRUG SCREEN, QUALITATIVE (ARMC ONLY)
Amphetamines, Ur Screen: POSITIVE — AB
Barbiturates, Ur Screen: NOT DETECTED
Benzodiazepine, Ur Scrn: NOT DETECTED
Cannabinoid 50 Ng, Ur ~~LOC~~: NOT DETECTED
Cocaine Metabolite,Ur ~~LOC~~: NOT DETECTED
MDMA (Ecstasy)Ur Screen: NOT DETECTED
Methadone Scn, Ur: NOT DETECTED
Opiate, Ur Screen: POSITIVE — AB
Phencyclidine (PCP) Ur S: NOT DETECTED
Tricyclic, Ur Screen: NOT DETECTED

## 2022-04-01 LAB — HEPATITIS B CORE ANTIBODY, TOTAL: Hep B Core Total Ab: NONREACTIVE

## 2022-04-01 LAB — HCV RNA QUANT
HCV Quantitative Log: 6.697 log10 IU/mL (ref >1.70)
HCV Quantitative: 4980000 IU/mL (ref >50)

## 2022-04-01 LAB — HEPATITIS B SURFACE ANTIGEN: Hepatitis B Surface Ag: NONREACTIVE

## 2022-04-01 SURGERY — INCISION AND DRAINAGE
Anesthesia: General | Site: Foot | Laterality: Right

## 2022-04-01 SURGERY — INCISION AND DRAINAGE
Anesthesia: Choice

## 2022-04-01 MED ORDER — OXYCODONE HCL 5 MG PO TABS: 5.0000 mg | ORAL_TABLET | Freq: Once | ORAL | Status: DC | PRN

## 2022-04-01 MED ORDER — DEXMEDETOMIDINE HCL IN NACL 80 MCG/20ML IV SOLN
INTRAVENOUS | Status: AC
  Filled 2022-04-01: qty 20

## 2022-04-01 MED ORDER — PHENYLEPHRINE 80 MCG/ML (10ML) SYRINGE FOR IV PUSH (FOR BLOOD PRESSURE SUPPORT)
PREFILLED_SYRINGE | INTRAVENOUS | Status: AC
  Filled 2022-04-01: qty 10

## 2022-04-01 MED ORDER — DEXMEDETOMIDINE HCL IN NACL 80 MCG/20ML IV SOLN
INTRAVENOUS | Status: DC | PRN
  Administered 2022-04-01: 8 ug via BUCCAL

## 2022-04-01 MED ORDER — ONDANSETRON HCL 4 MG/2ML IJ SOLN
INTRAMUSCULAR | Status: DC | PRN
  Administered 2022-04-01: 4 mg via INTRAVENOUS

## 2022-04-01 MED ORDER — LIDOCAINE HCL (CARDIAC) PF 100 MG/5ML IV SOSY
PREFILLED_SYRINGE | INTRAVENOUS | Status: DC | PRN
  Administered 2022-04-01: 80 mg via INTRAVENOUS

## 2022-04-01 MED ORDER — CEFAZOLIN SODIUM-DEXTROSE 2-4 GM/100ML-% IV SOLN
INTRAVENOUS | Status: AC
  Filled 2022-04-01: qty 100

## 2022-04-01 MED ORDER — VANCOMYCIN HCL 1000 MG IV SOLR
INTRAVENOUS | Status: AC
  Filled 2022-04-01: qty 20

## 2022-04-01 MED ORDER — FENTANYL CITRATE (PF) 100 MCG/2ML IJ SOLN
INTRAMUSCULAR | Status: AC
  Administered 2022-04-01: 25 ug via INTRAVENOUS
  Filled 2022-04-01: qty 2

## 2022-04-01 MED ORDER — BUPIVACAINE HCL 0.5 % IJ SOLN
INTRAMUSCULAR | Status: DC | PRN
  Administered 2022-04-01: 20 mL

## 2022-04-01 MED ORDER — SODIUM CHLORIDE 0.9 % IV SOLN: INTRAVENOUS | Status: DC | PRN

## 2022-04-01 MED ORDER — OXYCODONE HCL 5 MG/5ML PO SOLN: 5.0000 mg | Freq: Once | ORAL | Status: DC | PRN

## 2022-04-01 MED ORDER — FENTANYL CITRATE (PF) 100 MCG/2ML IJ SOLN
25.0000 ug | INTRAMUSCULAR | Status: AC | PRN
  Administered 2022-04-01 (×4): 25 ug via INTRAVENOUS

## 2022-04-01 MED ORDER — PHENYLEPHRINE 80 MCG/ML (10ML) SYRINGE FOR IV PUSH (FOR BLOOD PRESSURE SUPPORT)
PREFILLED_SYRINGE | INTRAVENOUS | Status: DC | PRN
  Administered 2022-04-01: 80 ug via INTRAVENOUS

## 2022-04-01 MED ORDER — PROPOFOL 10 MG/ML IV BOLUS
INTRAVENOUS | Status: AC
  Filled 2022-04-01: qty 20

## 2022-04-01 MED ORDER — LIDOCAINE HCL (PF) 2 % IJ SOLN
INTRAMUSCULAR | Status: AC
  Filled 2022-04-01: qty 5

## 2022-04-01 MED ORDER — MIDAZOLAM HCL 2 MG/2ML IJ SOLN: INTRAMUSCULAR | Status: DC | PRN

## 2022-04-01 MED ORDER — FENTANYL CITRATE (PF) 100 MCG/2ML IJ SOLN
INTRAMUSCULAR | Status: AC
  Filled 2022-04-01: qty 2

## 2022-04-01 MED ORDER — SODIUM CHLORIDE 0.9 % IR SOLN
Status: DC | PRN
  Administered 2022-04-01: 3000 mL

## 2022-04-01 MED ORDER — FENTANYL CITRATE (PF) 100 MCG/2ML IJ SOLN
INTRAMUSCULAR | Status: DC | PRN
  Administered 2022-04-01 (×3): 25 ug via INTRAVENOUS

## 2022-04-01 MED ORDER — MIDAZOLAM HCL 2 MG/2ML IJ SOLN
INTRAMUSCULAR | Status: AC
  Filled 2022-04-01: qty 2

## 2022-04-01 MED ORDER — BUPIVACAINE HCL (PF) 0.5 % IJ SOLN
INTRAMUSCULAR | Status: AC
  Filled 2022-04-01: qty 30

## 2022-04-01 MED ORDER — PROPOFOL 10 MG/ML IV BOLUS
INTRAVENOUS | Status: DC | PRN
  Administered 2022-04-01: 200 mg via INTRAVENOUS

## 2022-04-01 SURGICAL SUPPLY — 73 items
BLADE OSC/SAGITTAL MD 5.5X18 (BLADE) IMPLANT
BLADE OSCILLATING/SAGITTAL (BLADE)
BLADE SURG 15 STRL LF DISP TIS (BLADE) ×2 IMPLANT
BLADE SURG 15 STRL SS (BLADE) ×2
BLADE SW THK.38XMED LNG THN (BLADE) IMPLANT
BNDG COHESIVE 4X5 TAN STRL LF (GAUZE/BANDAGES/DRESSINGS) ×2 IMPLANT
BNDG COHESIVE 6X5 TAN ST LF (GAUZE/BANDAGES/DRESSINGS) ×2 IMPLANT
BNDG ELASTIC 4X5.8 VLCR STR LF (GAUZE/BANDAGES/DRESSINGS) ×2 IMPLANT
BNDG ESMARK 4X12 TAN STRL LF (GAUZE/BANDAGES/DRESSINGS) ×2 IMPLANT
BNDG GAUZE DERMACEA FLUFF 4 (GAUZE/BANDAGES/DRESSINGS) ×2 IMPLANT
BNDG STRETCH GAUZE 3IN X12FT (GAUZE/BANDAGES/DRESSINGS) ×2 IMPLANT
BOOT STEPPER DURA LG (SOFTGOODS) IMPLANT
CANISTER WOUND CARE 500ML ATS (WOUND CARE) ×2 IMPLANT
CHLORAPREP W/TINT 26 (MISCELLANEOUS) ×2 IMPLANT
CNTNR URN SCR LID CUP LEK RST (MISCELLANEOUS) IMPLANT
CONT SPEC 4OZ STRL OR WHT (MISCELLANEOUS) ×8
CUFF TOURN SGL QUICK 12 (TOURNIQUET CUFF) IMPLANT
CUFF TOURN SGL QUICK 18X4 (TOURNIQUET CUFF) IMPLANT
DRAPE FLUOR MINI C-ARM 54X84 (DRAPES) IMPLANT
DRAPE XRAY CASSETTE 23X24 (DRAPES) IMPLANT
DURAPREP 26ML APPLICATOR (WOUND CARE) ×2 IMPLANT
ELECT REM PT RETURN 9FT ADLT (ELECTROSURGICAL) ×2
ELECTRODE REM PT RTRN 9FT ADLT (ELECTROSURGICAL) ×2 IMPLANT
GAUZE PACKING 0.25INX5YD STRL (GAUZE/BANDAGES/DRESSINGS) ×2 IMPLANT
GAUZE SPONGE 4X4 12PLY STRL (GAUZE/BANDAGES/DRESSINGS) ×2 IMPLANT
GAUZE STRETCH 2X75IN STRL (MISCELLANEOUS) ×2 IMPLANT
GAUZE XEROFORM 1X8 LF (GAUZE/BANDAGES/DRESSINGS) ×2 IMPLANT
GLOVE BIO SURGEON STRL SZ7 (GLOVE) ×4 IMPLANT
GLOVE SURG SYN 7.5  E (GLOVE) ×4
GLOVE SURG SYN 7.5 E (GLOVE) ×4 IMPLANT
GLOVE SURG SYN 7.5 PF PI (GLOVE) ×4 IMPLANT
GLOVE SURG UNDER POLY LF SZ7.5 (GLOVE) ×2 IMPLANT
GOWN STRL REUS W/ TWL XL LVL3 (GOWN DISPOSABLE) ×4 IMPLANT
GOWN STRL REUS W/TWL MED LVL3 (GOWN DISPOSABLE) IMPLANT
GOWN STRL REUS W/TWL XL LVL3 (GOWN DISPOSABLE) ×4
HANDPIECE VERSAJET DEBRIDEMENT (MISCELLANEOUS) IMPLANT
IV NS 1000ML (IV SOLUTION) ×2
IV NS 1000ML BAXH (IV SOLUTION) ×2 IMPLANT
KIT DRSG VAC SLVR GRANUFM (MISCELLANEOUS) ×2 IMPLANT
KIT TURNOVER KIT A (KITS) ×2 IMPLANT
LABEL OR SOLS (LABEL) ×2 IMPLANT
MANIFOLD NEPTUNE II (INSTRUMENTS) ×2 IMPLANT
NDL FILTER BLUNT 18X1 1/2 (NEEDLE) ×2 IMPLANT
NDL HYPO 25X1 1.5 SAFETY (NEEDLE) ×4 IMPLANT
NEEDLE FILTER BLUNT 18X1 1/2 (NEEDLE) ×2 IMPLANT
NEEDLE HYPO 25X1 1.5 SAFETY (NEEDLE) ×4 IMPLANT
NS IRRIG 500ML POUR BTL (IV SOLUTION) ×2 IMPLANT
PACK EXTREMITY ARMC (MISCELLANEOUS) ×2 IMPLANT
PACKING GAUZE IODOFORM 1INX5YD (GAUZE/BANDAGES/DRESSINGS) ×2 IMPLANT
PAD ABD DERMACEA PRESS 5X9 (GAUZE/BANDAGES/DRESSINGS) ×2 IMPLANT
PULSAVAC PLUS IRRIG FAN TIP (DISPOSABLE) ×2
RASP SM TEAR CROSS CUT (RASP) IMPLANT
SHIELD FULL FACE ANTIFOG 7M (MISCELLANEOUS) ×2 IMPLANT
SOL PREP PVP 2OZ (MISCELLANEOUS) ×2
SOLUTION PREP PVP 2OZ (MISCELLANEOUS) ×2 IMPLANT
STOCKINETTE IMPERVIOUS 9X36 MD (GAUZE/BANDAGES/DRESSINGS) ×2 IMPLANT
SUT ETHILON 2 0 FS 18 (SUTURE) ×4 IMPLANT
SUT ETHILON 3-0 (SUTURE) IMPLANT
SUT ETHILON 4-0 (SUTURE) ×2
SUT ETHILON 4-0 FS2 18XMFL BLK (SUTURE) ×2
SUT MNCRL 3-0 UNDYED SH (SUTURE) IMPLANT
SUT MONOCRYL 3-0 UNDYED (SUTURE) ×2
SUT VIC AB 3-0 SH 27 (SUTURE) ×2
SUT VIC AB 3-0 SH 27X BRD (SUTURE) ×2 IMPLANT
SUT VIC AB 4-0 FS2 27 (SUTURE) ×2 IMPLANT
SUTURE ETHLN 4-0 FS2 18XMF BLK (SUTURE) ×2 IMPLANT
SWAB CULTURE AMIES ANAERIB BLU (MISCELLANEOUS) IMPLANT
SYR 10ML LL (SYRINGE) ×2 IMPLANT
SYR 3ML LL SCALE MARK (SYRINGE) ×2 IMPLANT
TIP FAN IRRIG PULSAVAC PLUS (DISPOSABLE) ×2 IMPLANT
TRAP FLUID SMOKE EVACUATOR (MISCELLANEOUS) ×2 IMPLANT
UNDERPAD 30X36 HEAVY ABSORB (UNDERPADS AND DIAPERS) ×2 IMPLANT
WATER STERILE IRR 500ML POUR (IV SOLUTION) ×2 IMPLANT

## 2022-04-01 SURGICAL SUPPLY — 60 items
BLADE OSC/SAGITTAL MD 5.5X18 (BLADE) IMPLANT
BLADE OSCILLATING/SAGITTAL (BLADE)
BLADE SURG 15 STRL LF DISP TIS (BLADE) ×1 IMPLANT
BLADE SURG 15 STRL SS (BLADE) ×1
BLADE SW THK.38XMED LNG THN (BLADE) IMPLANT
BNDG COHESIVE 4X5 TAN STRL LF (GAUZE/BANDAGES/DRESSINGS) ×1 IMPLANT
BNDG COHESIVE 6X5 TAN ST LF (GAUZE/BANDAGES/DRESSINGS) ×1 IMPLANT
BNDG ELASTIC 4X5.8 VLCR STR LF (GAUZE/BANDAGES/DRESSINGS) ×1 IMPLANT
BNDG ESMARK 4X12 TAN STRL LF (GAUZE/BANDAGES/DRESSINGS) ×1 IMPLANT
BNDG GAUZE DERMACEA FLUFF 4 (GAUZE/BANDAGES/DRESSINGS) ×1 IMPLANT
BNDG STRETCH GAUZE 3IN X12FT (GAUZE/BANDAGES/DRESSINGS) ×1 IMPLANT
CANISTER WOUND CARE 500ML ATS (WOUND CARE) ×1 IMPLANT
CUFF TOURN SGL QUICK 12 (TOURNIQUET CUFF) IMPLANT
CUFF TOURN SGL QUICK 18X4 (TOURNIQUET CUFF) IMPLANT
DRAPE FLUOR MINI C-ARM 54X84 (DRAPES) IMPLANT
DRAPE XRAY CASSETTE 23X24 (DRAPES) IMPLANT
DRSG MEPILEX FLEX 3X3 (GAUZE/BANDAGES/DRESSINGS) IMPLANT
DURAPREP 26ML APPLICATOR (WOUND CARE) ×1 IMPLANT
ELECT REM PT RETURN 9FT ADLT (ELECTROSURGICAL) ×1
ELECTRODE REM PT RTRN 9FT ADLT (ELECTROSURGICAL) ×1 IMPLANT
GAUZE PACKING 0.25INX5YD STRL (GAUZE/BANDAGES/DRESSINGS) ×1 IMPLANT
GAUZE SPONGE 4X4 12PLY STRL (GAUZE/BANDAGES/DRESSINGS) ×1 IMPLANT
GAUZE STRETCH 2X75IN STRL (MISCELLANEOUS) ×1 IMPLANT
GAUZE XEROFORM 1X8 LF (GAUZE/BANDAGES/DRESSINGS) ×1 IMPLANT
GLOVE BIO SURGEON STRL SZ7.5 (GLOVE) ×1 IMPLANT
GLOVE SURG UNDER LTX SZ8 (GLOVE) ×1 IMPLANT
GOWN STRL REUS W/ TWL XL LVL3 (GOWN DISPOSABLE) ×2 IMPLANT
GOWN STRL REUS W/TWL MED LVL3 (GOWN DISPOSABLE) ×1 IMPLANT
GOWN STRL REUS W/TWL XL LVL3 (GOWN DISPOSABLE) ×2
HANDPIECE VERSAJET DEBRIDEMENT (MISCELLANEOUS) IMPLANT
IV NS 1000ML (IV SOLUTION) ×1
IV NS 1000ML BAXH (IV SOLUTION) ×1 IMPLANT
IV NS IRRIG 3000ML ARTHROMATIC (IV SOLUTION) ×1 IMPLANT
KIT DRSG VAC SLVR GRANUFM (MISCELLANEOUS) ×1 IMPLANT
KIT TURNOVER KIT A (KITS) ×1 IMPLANT
LABEL OR SOLS (LABEL) ×1 IMPLANT
MANIFOLD NEPTUNE II (INSTRUMENTS) ×1 IMPLANT
NEEDLE FILTER BLUNT 18X1 1/2 (NEEDLE) ×1 IMPLANT
NEEDLE HYPO 25X1 1.5 SAFETY (NEEDLE) ×1 IMPLANT
NS IRRIG 500ML POUR BTL (IV SOLUTION) ×1 IMPLANT
PACK EXTREMITY ARMC (MISCELLANEOUS) ×1 IMPLANT
PACKING GAUZE IODOFORM 1INX5YD (GAUZE/BANDAGES/DRESSINGS) ×1 IMPLANT
PAD ABD DERMACEA PRESS 5X9 (GAUZE/BANDAGES/DRESSINGS) ×1 IMPLANT
PULSAVAC PLUS IRRIG FAN TIP (DISPOSABLE) ×1
RASP SM TEAR CROSS CUT (RASP) IMPLANT
SHIELD FULL FACE ANTIFOG 7M (MISCELLANEOUS) ×1 IMPLANT
STOCKINETTE IMPERVIOUS 9X36 MD (GAUZE/BANDAGES/DRESSINGS) ×1 IMPLANT
SUT ETHILON 2 0 FS 18 (SUTURE) ×2 IMPLANT
SUT ETHILON 4-0 (SUTURE) ×1
SUT ETHILON 4-0 FS2 18XMFL BLK (SUTURE) ×1
SUT VIC AB 3-0 SH 27 (SUTURE) ×1
SUT VIC AB 3-0 SH 27X BRD (SUTURE) ×1 IMPLANT
SUT VIC AB 4-0 FS2 27 (SUTURE) ×1 IMPLANT
SUTURE ETHLN 4-0 FS2 18XMF BLK (SUTURE) ×1 IMPLANT
SWAB CULTURE AMIES ANAERIB BLU (MISCELLANEOUS) IMPLANT
SYR 10ML LL (SYRINGE) ×1 IMPLANT
SYR 3ML LL SCALE MARK (SYRINGE) ×1 IMPLANT
TIP FAN IRRIG PULSAVAC PLUS (DISPOSABLE) ×1 IMPLANT
TRAP FLUID SMOKE EVACUATOR (MISCELLANEOUS) ×1 IMPLANT
WATER STERILE IRR 500ML POUR (IV SOLUTION) ×1 IMPLANT

## 2022-04-01 NOTE — Progress Notes (Signed)
  Transition of Care [facility]) Screening Note   Patient Details  Name: Erik Barker Date of Birth: 1970-08-16   Transition of Care [facility]) CM/SW Contact:    Colin Broach, LCSW Phone Number: 04/01/2022, 10:55 AM    Transition of Care Department [facility]) has reviewed patient and no TOC needs have been identified at this time. We will continue to monitor patient advancement through interdisciplinary progression rounds. If new patient transition needs arise, please place a TOC consult.

## 2022-04-01 NOTE — Anesthesia Procedure Notes (Signed)
Procedure Name: LMA Insertion Date/Time: 04/01/2022 4:09 PM  Performed by: Elmarie Mainland, CRNAPre-anesthesia Checklist: Emergency Drugs available, Patient identified, Suction available and Patient being monitored Patient Re-evaluated:Patient Re-evaluated prior to induction Oxygen Delivery Method: Circle system utilized Preoxygenation: Pre-oxygenation with 100% oxygen Induction Type: IV induction LMA: LMA inserted LMA Size: 5.0 Number of attempts: 1 Placement Confirmation: positive ETCO2 and breath sounds checked- equal and bilateral Tube secured with: Tape Dental Injury: Teeth and Oropharynx as per pre-operative assessment
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
## 2022-04-01 NOTE — Plan of Care (Signed)
  Problem: Skin Integrity: Goal: Skin integrity will improve Outcome: Progressing   Problem: Clinical Measurements: Goal: Will remain free from infection Outcome: Progressing   Problem: Pain Managment: Goal: General experience of comfort will improve Outcome: Progressing   Problem: Safety: Goal: Ability to remain free from injury will improve Outcome: Progressing

## 2022-04-01 NOTE — Brief Op Note (Signed)
04/01/2022  4:56 PM  PATIENT:  Erik Barker  51 y.o. male  PRE-OPERATIVE DIAGNOSIS: Osteomyelitis, septic arthritis of right foot  POST-OPERATIVE DIAGNOSIS: Osteomyelitis and septic arthritis  PROCEDURE:  Procedure(s): INCISION AND DRAINAGE (N/A) BONE BIOPSY (N/A)  SURGEON:  Surgeon(s) and Role:    * Aashritha Miedema, Rachelle Hora, DPM - Primary    ASSISTANTS: none   ANESTHESIA:   general  EBL:  10 mL   BLOOD ADMINISTERED:none  DRAINS: none   LOCAL MEDICATIONS USED:  MARCAINE    and Amount: 20 ml  SPECIMEN: First metatarsal and medial cuneiform bone biopsy and bone culture, septic arthritis joint culture, deep wound culture of right foot  DISPOSITION OF SPECIMEN: Pathology and microbiology  COUNTS:  YES  TOURNIQUET: 24 minutes 250 mmHg in the ankle  DICTATION: .Note written in EPIC  PLAN OF CARE: Admit to inpatient   PATIENT DISPOSITION:  PACU - hemodynamically stable.   Delay start of Pharmacological VTE agent (>24hrs) due to surgical blood loss or risk of bleeding: no   -Bone was soft and fragmented.  Multiple cultures and biopsies taken -No gross purulence -Osteomyelitis and septic arthritis still remains the most likely diagnosis.  Gout and Charcot arthropathy also within the differential.  Uric acid ordered -WBAT in cam walker boot.  Ordered -No further surgical plans at this time we will follow-up on cultures and biopsies

## 2022-04-01 NOTE — Progress Notes (Signed)
History and Physical Interval Note:  04/01/2022 2:57 PM  Erik Barker  has presented today for surgery, with the diagnosis of osteomyelitis and ulcer of his right foot.  The various methods of treatment have been discussed with the patient and family. After consideration of risks, benefits and other options for treatment, the patient has consented to   Procedure(s): INCISION AND DRAINAGE (N/A) BONE BIOPSY (N/A) as a surgical intervention.  The patient's history has been reviewed, patient examined, no change in status, stable for surgery.  I have reviewed the patient's chart and labs.  Questions were answered to the patient's satisfaction.    His UDS remains positive for amphetamine. I discussed with the anesthesiologist and due to the nature of the infection I believe he is at risk of further loss of limb and spread of infection if we delay surgery any further and should proceed due to this emergent nature    Edwin Cap

## 2022-04-01 NOTE — Assessment & Plan Note (Signed)
Will check genotype and viral load.

## 2022-04-01 NOTE — Transfer of Care (Signed)
Immediate Anesthesia Transfer of Care Note  Patient: DOMANI BAKOS  Procedure(s) Performed: INCISION AND DRAINAGE BONE BIOPSY  Patient Location: PACU  Anesthesia Type:General  Level of Consciousness: drowsy  Airway & Oxygen Therapy: Patient Spontanous Breathing  Post-op Assessment: Report given to RN and Post -op Vital signs reviewed and stable  Post vital signs: Reviewed and stable  Last Vitals:  Vitals Value Taken Time  BP 100/64 04/01/22 1655  Temp    Pulse 64 04/01/22 1658  Resp 12 04/01/22 1658  SpO2 100 % 04/01/22 1658  Vitals shown include unvalidated device data.  Last Pain:  Vitals:   04/01/22 1503  TempSrc: Tympanic  PainSc: 6       Patients Stated Pain Goal: 4 (04/01/22 1300)  Complications: No notable events documented.

## 2022-04-02 DIAGNOSIS — E871 Hypo-osmolality and hyponatremia: Secondary | ICD-10-CM

## 2022-04-02 LAB — CBC
HCT: 41 % (ref 39.0–52.0)
Hemoglobin: 13.7 g/dL (ref 13.0–17.0)
MCH: 29.1 pg (ref 26.0–34.0)
MCHC: 33.4 g/dL (ref 30.0–36.0)
MCV: 87 fL (ref 80.0–100.0)
Platelets: 285 10*3/uL (ref 150–400)
RBC: 4.71 MIL/uL (ref 4.22–5.81)
RDW: 13.7 % (ref 11.5–15.5)
WBC: 8.3 10*3/uL (ref 4.0–10.5)
nRBC: 0 % (ref 0.0–0.2)

## 2022-04-02 LAB — BASIC METABOLIC PANEL
Anion gap: 6 (ref 5–15)
BUN: 12 mg/dL (ref 6–20)
CO2: 27 mmol/L (ref 22–32)
Calcium: 8.4 mg/dL — ABNORMAL LOW (ref 8.9–10.3)
Chloride: 101 mmol/L (ref 98–111)
Creatinine, Ser: 0.76 mg/dL (ref 0.61–1.24)
GFR, Estimated: >60 mL/min (ref >60)
Glucose, Bld: 151 mg/dL — ABNORMAL HIGH (ref 70–99)
Potassium: 4.2 mmol/L (ref 3.5–5.1)
Sodium: 134 mmol/L — ABNORMAL LOW (ref 135–145)

## 2022-04-02 LAB — URIC ACID: Uric Acid, Serum: 2.3 mg/dL — ABNORMAL LOW (ref 3.7–8.6)

## 2022-04-02 LAB — RESP PANEL BY RT-PCR (RSV, FLU A&B, COVID)  RVPGX2
Influenza A by PCR: NEGATIVE
Influenza B by PCR: NEGATIVE
Resp Syncytial Virus by PCR: NEGATIVE
SARS Coronavirus 2 by RT PCR: NEGATIVE

## 2022-04-02 LAB — HEPATITIS B SURFACE ANTIBODY, QUANTITATIVE: Hep B S AB Quant (Post): <3.1 m[IU]/mL — ABNORMAL LOW (ref >9.9)

## 2022-04-02 MED ORDER — MUPIROCIN 2 % EX OINT
TOPICAL_OINTMENT | Freq: Every day | CUTANEOUS | Status: DC
  Filled 2022-04-02: qty 22

## 2022-04-02 NOTE — Plan of Care (Signed)

## 2022-04-02 NOTE — Anesthesia Postprocedure Evaluation (Signed)
Anesthesia Post Note  Patient: Erik Barker  Procedure(s) Performed: INCISION AND DRAINAGE (Right: Foot) BONE BIOPSY (Foot)  Patient location during evaluation: PACU Anesthesia Type: General Level of consciousness: awake and alert Pain management: pain level controlled Vital Signs Assessment: post-procedure vital signs reviewed and stable Respiratory status: spontaneous breathing, nonlabored ventilation, respiratory function stable and patient connected to nasal cannula oxygen Cardiovascular status: blood pressure returned to baseline and stable Postop Assessment: no apparent nausea or vomiting Anesthetic complications: no   No notable events documented.   Last Vitals:  Vitals:   04/02/22 0032 04/02/22 0407  BP: 115/63 118/79  Pulse: 79 74  Resp: 18 18  Temp: 36.7 C 36.9 C  SpO2: 99% 100%    Last Pain:  Vitals:   04/02/22 0505  TempSrc:   PainSc: Asleep                 Lenard Simmer

## 2022-04-02 NOTE — Progress Notes (Deleted)
Patient refused heparin, NP notified.

## 2022-04-02 NOTE — Progress Notes (Signed)
  Subjective:  Patient ID: Erik Barker, male    DOB: Nov 01, 1970,  MRN: [id number]   POD#1 R foot I&D, biopsy of bone x2, cultures taken. Having pain in foot  Negative for chest pain and shortness of breath Review of all other systems is negative Objective:   Vitals:   04/02/22 0407 04/02/22 0900  BP: 118/79 115/73  Pulse: 74 71  Resp: 18 17  Temp: 98.5 F (36.9 C) 98.7 F (37.1 C)  SpO2: 100% 98%   General AA&O x3. Normal mood and affect.  Vascular Dorsalis pedis and posterior tibial pulses 2/4 bilat. Brisk capillary refill to all digits. Pedal hair present.  Neurologic Epicritic sensation grossly intact.  Dermatologic Incision healing well. Foot is still swollen. Cellulitis improving  Orthopedic: MMT 5/5 in dorsiflexion, plantarflexion, inversion, and eversion. Normal joint ROM without pain or crepitus.    Assessment & Plan:  Patient was evaluated and treated and all questions answered.  Osteomyelitis right foot, septic arthritis -WBAT in CAM boot. Please obtain from OR or central supply -No further surgical plans at this time -ID following, TEE recommended. NGTD on cultures -Path pending -Change dressing daily with mupirocin to plantar ulcer. Order placed for mupirocin. Packing pulled today -Will see tomorrow.   Edwin Cap, DPM  Accessible via secure chat for questions or concerns.

## 2022-04-02 NOTE — Op Note (Signed)
Patient Name: Erik Barker DOB: 10-20-1970  MRN: [id number]   Date of Service: 03/30/2022 - 04/01/2022  Surgeon: Dr. Lanae Crumbly, DPM Assistants: None Pre-operative Diagnosis:  Osteomyelitis right foot. Puncture wound right foot Septic arthritis right foot Post-operative Diagnosis:  Osteomyelitis right foot. Puncture wound right foot Septic arthritis right foot Procedures:  1) incision bone cortex right foot with bone biopsy and joint culture and excisional debridement of wound Pathology/Specimens: ID Type Source Tests Collected by Time Destination  1 : Right 1st Metatarsal Biopsy Tissue PATH Other SURGICAL PATHOLOGY Criselda Peaches, DPM 04/01/2022 1638   2 : Right Medio Cuneiform Biopsy Tissue PATH Other SURGICAL PATHOLOGY Criselda Peaches, DPM 04/01/2022 1638   A : Right 1st Joint Torso Metatarsal (Culture) Tissue PATH Other AEROBIC/ANAEROBIC CULTURE W Lonell Grandchild STAIN (SURGICAL/DEEP WOUND) Criselda Peaches, DPM 04/01/2022 1634   B : Right Deep Wound Culture Tissue PATH Other AEROBIC/ANAEROBIC CULTURE W Lonell Grandchild STAIN (SURGICAL/DEEP WOUND) Criselda Peaches, DPM 04/01/2022 1635   C : Right 1st Metatarsal (Culture) Tissue PATH Other AEROBIC/ANAEROBIC CULTURE W GRAM STAIN (SURGICAL/DEEP WOUND) Criselda Peaches, DPM 04/01/2022 1637   D : Right Medio Cuneiform (Culture) Tissue PATH Other AEROBIC/ANAEROBIC CULTURE W GRAM STAIN (SURGICAL/DEEP WOUND) Criselda Peaches, DPM 04/01/2022 1637    Anesthesia: MAC with local Hemostasis:  Total Tourniquet Time Documented: area (laterality) - 25 minutes Total: area (laterality) - 25 minutes  Estimated Blood Loss: 10 mL Materials: * No implants in log * Medications: 20 cc 0.5% Marcaine plain Complications: No complication noted  Indications for Procedure:  This is a 51 y.o. male with a previous puncture wound suffered approximately 4 weeks ago, he had presented to the ER multiple times and left [facility] until his most recent  presentation.  He was readmitted in preoperative CT showed concern for osteomyelitis and septic arthritis of the first tarsometatarsal joint with a plantar puncture wound.  Operative debridement and biopsy was recommended   Procedure in Detail: Patient was identified in pre-operative holding area. Formal consent was signed and the right lower extremity was marked. Patient was brought back to the operating room. Anesthesia was induced. The extremity was prepped and draped in the usual sterile fashion. Timeout was taken to confirm patient name, laterality, and procedure prior to incision.   Attention was then directed to the right foot where a linear incision was made over the first tarsometatarsal joint.  This carried deep through subcutaneous tissue, cauterizing bleeding vessels as necessary.  The periosteum was reflected from the dorsal medial cuneiform and first metatarsal and an arthrotomy was made of the first tarsometatarsal joint.  There is no gross purulence but the bone did appear to be soft and necrotic.  A rongeur was used to obtain bone biopsies of the base of the dorsal first metatarsal and dorsal cuneiform.  These biopsies were sent for both pathology and bone culture.  I inspected the first tarsometatarsal joint.  There was erosion and sclerosis and collapse of the subchondral bone consistent with a septic arthritis.  A culture of the joint fluid was taken.  The wound was then thoroughly irrigated with normal sterile saline with a pulse irrigator.  I then direct my attention to the plantar puncture wound and ulceration and this was debrided in excisional full-thickness manner to remove the nonviable tissue.  Again no gross purulence was encountered.  The penetration did present deep to the level of the base of the first metatarsal and communicated directly with the first tarsometatarsal  joint.  A culture of this area was again taken.  The wound was again irrigated thoroughly with the pulse  irrigator from the plantar approach.  Once irrigation was complete the dorsal incision was closed with Monocryl and nylon.  The plantar incision was partially closed with nylon and packed with gauze packing.  The foot was then dressed with Xeroform and dry sterile dressings. Patient tolerated the procedure well.   Disposition: Following a period of post-operative monitoring, patient will be transferred to the floor.

## 2022-04-02 NOTE — Assessment & Plan Note (Signed)
Sodium 1 point less than normal range. 
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
## 2022-04-03 ENCOUNTER — Encounter: Payer: Self-pay | Admitting: Podiatry

## 2022-04-03 DIAGNOSIS — B1920 Unspecified viral hepatitis C without hepatic coma: Secondary | ICD-10-CM

## 2022-04-03 DIAGNOSIS — M869 Osteomyelitis, unspecified: Secondary | ICD-10-CM

## 2022-04-03 MED ORDER — LEVOFLOXACIN 500 MG PO TABS
750.0000 mg | ORAL_TABLET | Freq: Every day | ORAL | Status: DC
  Administered 2022-04-03 – 2022-04-04 (×2): 750 mg via ORAL
  Filled 2022-04-03 (×2): qty 2

## 2022-04-03 NOTE — Progress Notes (Signed)
Pt refused dressing change at the moment, stated "the doctor did it already."

## 2022-04-03 NOTE — Progress Notes (Signed)
Subjective:  Patient ID: SHOURYA MACPHERSON, male    DOB: 1970/09/23,  MRN: [id number]   POD#2 R foot I&D, biopsy of bone x2, cultures taken. Having pain in foot  Negative for chest pain and shortness of breath Review of all other systems is negative Objective:   Vitals:   04/03/22 0556 04/03/22 0817  BP: 106/68 110/67  Pulse: 73 66  Resp: 16 18  Temp: 97.8 F (36.6 C) 97.6 F (36.4 C)  SpO2: 100% 100%   General AA&O x3. Normal mood and affect.  Vascular Dorsalis pedis and posterior tibial pulses 2/4 bilat. Brisk capillary refill to all digits. Pedal hair present.  Neurologic Epicritic sensation grossly intact.  Dermatologic Incision healing well. Foot is still swollen. Cellulitis improving  Orthopedic: MMT 5/5 in dorsiflexion, plantarflexion, inversion, and eversion. Normal joint ROM without pain or crepitus.   Results for orders placed or performed during the hospital encounter of 03/30/22  Culture, blood (Routine X 2) w Reflex to ID Panel     Status: None (Preliminary result)   Collection Time: 03/31/22  3:05 PM   Specimen: BLOOD  Result Value Ref Range Status   Specimen Description BLOOD LEFT ANTECUBITAL  Final   Special Requests   Final    BOTTLES DRAWN AEROBIC AND ANAEROBIC Blood Culture adequate volume   Culture   Final    NO GROWTH 3 DAYS Performed at [facility], [address] [place], [place] [address]    Report Status PENDING  Incomplete  Culture, blood (Routine X 2) w Reflex to ID Panel     Status: None (Preliminary result)   Collection Time: 03/31/22  3:10 PM   Specimen: BLOOD  Result Value Ref Range Status   Specimen Description BLOOD RIGHT ANTECUBITAL  Final   Special Requests   Final    BOTTLES DRAWN AEROBIC AND ANAEROBIC Blood Culture adequate volume   Culture   Final    NO GROWTH 3 DAYS Performed at [facility], [address] [place], [place] [address]    Report Status PENDING  Incomplete  Aerobic/Anaerobic Culture w Gram  Stain (surgical/deep wound)     Status: None (Preliminary result)   Collection Time: 04/01/22  4:34 PM   Specimen: PATH Other; Tissue  Result Value Ref Range Status   Specimen Description   Final    WOUND Performed at [facility], [address] [place], [place] [address]    Special Requests   Final    NONE Performed at [facility], [address] [place], [place] [address]    Gram Stain   Final    RARE WBC PRESENT, PREDOMINANTLY PMN NO ORGANISMS SEEN    Culture   Final    RARE STAPHYLOCOCCUS AUREUS RARE ENTEROBACTER SPECIES RARE SERRATIA MARCESCENS CULTURE REINCUBATED FOR BETTER GROWTH Performed at [facility], [address], [place], [place] [address]    Report Status PENDING  Incomplete  Aerobic/Anaerobic Culture w Gram Stain (surgical/deep wound)     Status: None (Preliminary result)   Collection Time: 04/01/22  4:35 PM   Specimen: PATH Other; Tissue  Result Value Ref Range Status   Specimen Description   Final    WOUND Performed at [facility], [address] [place], [place] [address]    Special Requests RIGHT METATARSAL  Final   Gram Stain   Final    RARE WBC PRESENT, PREDOMINANTLY PMN NO ORGANISMS SEEN    Culture   Final  CULTURE REINCUBATED FOR BETTER GROWTH Performed at [facility], [place] [address] [place], [place] [address]    Report Status PENDING  Incomplete  Aerobic/Anaerobic Culture w Gram Stain (surgical/deep wound)     Status: None (Preliminary result)   Collection Time: 04/01/22  4:37 PM   Specimen: PATH Other; Tissue  Result Value Ref Range Status   Specimen Description   Final    WOUND Performed at [facility], [address] [place], [place] [address]    Special Requests   Final    NONE Performed at [facility], [address] [place], [place] [address]    Gram Stain NO ORGANISMS SEEN NO WBC SEEN   Final   Culture   Final    CULTURE REINCUBATED FOR BETTER  GROWTH Performed at [facility], [place] [address] [place], [place] [address]    Report Status PENDING  Incomplete  Aerobic/Anaerobic Culture w Gram Stain (surgical/deep wound)     Status: None (Preliminary result)   Collection Time: 04/01/22  4:37 PM   Specimen: PATH Other; Tissue  Result Value Ref Range Status   Specimen Description   Final    WOUND Performed at [facility], [address] [place], [place] [address]    Special Requests   Final    NONE Performed at [facility], [place], [place] [address]    Gram Stain NO ORGANISMS SEEN NO WBC SEEN   Final   Culture   Final    NO GROWTH < 24 HOURS Performed at [facility], [place] [address] [place], [place] [address]    Report Status PENDING  Incomplete  Resp panel by RT-PCR (RSV, Flu A&B, Covid) Anterior Nasal Swab     Status: None   Collection Time: 04/02/22  2:50 PM   Specimen: Anterior Nasal Swab  Result Value Ref Range Status   SARS Coronavirus 2 by RT PCR NEGATIVE NEGATIVE Final    Comment: (NOTE) SARS-CoV-2 target nucleic acids are NOT DETECTED.  The SARS-CoV-2 RNA is generally detectable in upper respiratory specimens during the acute phase of infection. The lowest concentration of SARS-CoV-2 viral copies this assay can detect is 138 copies/mL. A negative result does not preclude SARS-Cov-2 infection and should not be used as the sole basis for treatment or other patient management decisions. A negative result may occur with  improper specimen collection/handling, submission of specimen other than nasopharyngeal swab, presence of viral mutation(s) within the areas targeted by this assay, and inadequate number of viral copies(<138 copies/mL). A negative result must be combined with clinical observations, patient history, and epidemiological information. The expected result is Negative.  Fact Sheet for Patients:  EntrepreneurPulse.com.au  Fact  Sheet for Healthcare Providers:  IncredibleEmployment.be  This test is no t yet approved or cleared by the [place] FDA and  has been authorized for detection and/or diagnosis of SARS-CoV-2 by FDA under an Emergency Use Authorization (EUA). This EUA will remain  in effect (meaning this test can be used) for the duration of the COVID-19 declaration under Section 564(b)(1) of the Act, 21 U.S.C.section 360bbb-3(b)(1), unless the authorization is terminated  or revoked sooner.       Influenza A by PCR NEGATIVE NEGATIVE Final   Influenza B by PCR NEGATIVE NEGATIVE Final    Comment: (NOTE) The Xpert Xpress SARS-CoV-2/FLU/RSV plus assay is intended as an aid in the diagnosis of influenza from Nasopharyngeal swab specimens and should not be  used as a sole basis for treatment. Nasal washings and aspirates are unacceptable for Xpert Xpress SARS-CoV-2/FLU/RSV testing.  Fact Sheet for Patients: EntrepreneurPulse.com.au  Fact Sheet for Healthcare Providers: IncredibleEmployment.be  This test is not yet approved or cleared by the [place] FDA and has been authorized for detection and/or diagnosis of SARS-CoV-2 by FDA under an Emergency Use Authorization (EUA). This EUA will remain in effect (meaning this test can be used) for the duration of the COVID-19 declaration under Section 564(b)(1) of the Act, 21 U.S.C. section 360bbb-3(b)(1), unless the authorization is terminated or revoked.     Resp Syncytial Virus by PCR NEGATIVE NEGATIVE Final    Comment: (NOTE) Fact Sheet for Patients: EntrepreneurPulse.com.au  Fact Sheet for Healthcare Providers: IncredibleEmployment.be  This test is not yet approved or cleared by the [place] FDA and has been authorized for detection and/or diagnosis of SARS-CoV-2 by FDA under an Emergency Use Authorization (EUA). This EUA will remain in effect  (meaning this test can be used) for the duration of the COVID-19 declaration under Section 564(b)(1) of the Act, 21 U.S.C. section 360bbb-3(b)(1), unless the authorization is terminated or revoked.  Performed at [facility], [address], [place], [place] [address]      Assessment & Plan:  Patient was evaluated and treated and all questions answered.  Osteomyelitis right foot, septic arthritis -WBAT in CAM boot. Please obtain from OR or central supply -No further surgical plans at this time -ID following, TEE recommended. Culture is showing staph aureus, enterobacter, serratia. Unfortunately the culture labels do not appear to be consistent from what I recommended in the OR. As the first culture taken I suspect this is the 1st TMT joint fluid culture. -Path pending -Change dressing daily with mupirocin to plantar ulcer. Order placed for dressing changes  -Will see tomorrow.   Criselda Peaches, DPM  Accessible via secure chat for questions or concerns.

## 2022-04-03 NOTE — Plan of Care (Signed)
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
## 2022-04-04 ENCOUNTER — Inpatient Hospital Stay: Payer: Self-pay

## 2022-04-04 LAB — APTT: aPTT: 29 seconds (ref 24–36)

## 2022-04-04 LAB — HEPATITIS A ANTIBODY, TOTAL: hep A Total Ab: NONREACTIVE

## 2022-04-04 LAB — PROTIME-INR
INR: 1 (ref 0.8–1.2)
Prothrombin Time: 13.3 seconds (ref 11.4–15.2)

## 2022-04-04 LAB — ETHANOL: Alcohol, Ethyl (B): <10 mg/dL (ref <10)

## 2022-04-04 MED ORDER — CEPHALEXIN 500 MG PO CAPS: 1000.0000 mg | ORAL_CAPSULE | Freq: Four times a day (QID) | ORAL | 0 refills | Status: AC

## 2022-04-04 MED ORDER — POLYETHYLENE GLYCOL 3350 17 G PO PACK
17.0000 g | PACK | Freq: Two times a day (BID) | ORAL | Status: DC
  Administered 2022-04-04: 17 g via ORAL
  Filled 2022-04-04: qty 1

## 2022-04-04 NOTE — Discharge Summary (Addendum)
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 Performed at [facility], [address] [place], [place] [address]    Gram Stain   Final    NO ORGANISMS SEEN NO WBC SEEN Performed at [facility], [address] [place], [place] [address]    Culture   Final    RARE STAPHYLOCOCCUS AUREUS SUSCEPTIBILITIES PERFORMED ON PREVIOUS CULTURE WITHIN THE LAST 5 DAYS. NO ANAEROBES ISOLATED; CULTURE IN PROGRESS FOR 5 DAYS    Report Status PENDING  Incomplete  Aerobic/Anaerobic Culture w Gram Stain (surgical/deep wound)     Status: None (Preliminary result)   Collection Time: 04/01/22  4:37 PM   Specimen: PATH Other; Tissue  Result Value Ref Range Status   Specimen Description   Final    WOUND Performed at [facility], [address] [place], [place] [address]    Special Requests   Final    NONE Performed at [facility], [address] [place], [place] [address]    Gram Stain   Final    NO ORGANISMS SEEN NO WBC SEEN Performed at [facility],  [address], [place], [place] [address]    Culture   Final    RARE STAPHYLOCOCCUS AUREUS SUSCEPTIBILITIES TO FOLLOW NO ANAEROBES ISOLATED; CULTURE IN PROGRESS FOR 5 DAYS    Report Status PENDING  Incomplete  Resp panel by RT-PCR (RSV, Flu A&B, Covid) Anterior Nasal Swab     Status: None   Collection Time: 04/02/22  2:50 PM   Specimen: Anterior Nasal Swab  Result Value Ref Range Status   SARS Coronavirus 2 by RT PCR NEGATIVE NEGATIVE Final    Comment: (NOTE) SARS-CoV-2 target nucleic acids are NOT DETECTED.  The SARS-CoV-2 RNA is generally detectable in upper respiratory specimens during the acute phase of infection. The lowest concentration of SARS-CoV-2 viral copies this assay can detect is 138 copies/mL. A negative result does not preclude SARS-Cov-2 infection and should not be used as the sole basis for treatment or other patient management decisions. A negative result may occur with  improper specimen collection/handling, submission of specimen other than nasopharyngeal swab, presence of viral mutation(s) within the areas targeted by this assay, and inadequate number of viral copies(<138 copies/mL). A negative result must be combined with clinical observations, patient history, and epidemiological information. The expected result is Negative.  Fact Sheet for Patients:  BloggerCourse.com  Fact Sheet for Healthcare  Providers:  SeriousBroker.it  This test is no t yet approved or cleared by the [place] and  has been authorized for detection and/or diagnosis of SARS-CoV-2 by FDA under an Emergency Use Authorization (EUA). This EUA will remain  in effect (meaning this test can be used) for the duration of the COVID-19 declaration under Section 564(b)(1) of the Act, 21 U.S.C.section 360bbb-3(b)(1), unless the authorization is terminated  or revoked sooner.       Influenza A by PCR NEGATIVE NEGATIVE Final   Influenza B  by PCR NEGATIVE NEGATIVE Final    Comment: (NOTE) The Xpert Xpress SARS-CoV-2/FLU/RSV plus assay is intended as an aid in the diagnosis of influenza from Nasopharyngeal swab specimens and should not be used as a sole basis for treatment. Nasal washings and aspirates are unacceptable for Xpert Xpress SARS-CoV-2/FLU/RSV testing.  Fact Sheet for Patients: BloggerCourse.com  Fact Sheet for Healthcare Providers: SeriousBroker.it  This test is not yet approved or cleared by the [place] FDA and has been authorized for detection and/or diagnosis of SARS-CoV-2 by FDA under an Emergency Use Authorization (EUA). This EUA will remain in effect (meaning this test can be used) for the duration of the COVID-19 declaration under Section 564(b)(1) of the Act, 21 U.S.C. section 360bbb-3(b)(1), unless the authorization is terminated or revoked.     Resp Syncytial Virus by PCR NEGATIVE NEGATIVE Final    Comment: (NOTE) Fact Sheet for Patients: BloggerCourse.com  Fact Sheet for Healthcare Providers: SeriousBroker.it  This test is not yet approved or cleared by the [place] FDA and has been authorized for detection and/or diagnosis of SARS-CoV-2 by FDA under an Emergency Use Authorization (EUA). This EUA will remain in effect (meaning this test can be used) for the duration of the COVID-19 declaration under Section 564(b)(1) of the Act, 21 U.S.C. section 360bbb-3(b)(1), unless the authorization is terminated or revoked.  Performed at [facility], [address] [place], [place] [address]     Labs: CBC: Recent Labs  Lab 03/30/22 0151 03/31/22 0306 04/02/22 0541  WBC 6.8 5.4 8.3  NEUTROABS 4.2  --   --   HGB 12.8* 13.1 13.7  HCT 39.5 39.7 41.0  MCV 88.6 88.0 87.0  PLT 310 297 285   Basic Metabolic Panel: Recent Labs  Lab 03/30/22 0151 03/31/22 0306  04/02/22 0541  NA 138 138 134*  K 3.7 3.8 4.2  CL 107 107 101  CO2 25 25 27   GLUCOSE 124* 106* 151*  BUN 16 12 12   CREATININE 0.90 0.87 0.76  CALCIUM 8.8* 8.3* 8.4*     Discharge time spent: greater than 30 minutes.  Spoke with infectious disease specialist and nursing staff numerous times.  Spoke with cardiology  Signed: , MD [facility] 04/04/2022

## 2022-04-04 NOTE — Plan of Care (Signed)

## 2022-04-04 NOTE — Consult Note (Signed)
Erik Barker is Barker 51 y.o. male  [id number]  Primary Cardiologist: Adrian Blackwater Reason for Consultation: Rule out endocarditis  HPI: 51 year old white male with past medical history of heroin abuse admitted to the hospital with osteomyelitis.  I was asked to evaluate the patient for possible endocarditis by doing TEE.   Review of Systems: No chest pain shortness of breath   Past Medical History:  Diagnosis Date   Heroin use    Methamphetamine use (HCC)     Medications Prior to Admission  Medication Sig Dispense Refill   cephALEXin (KEFLEX) 500 MG capsule Take 1 capsule (500 mg total) by mouth 4 (four) times daily for 7 days. 28 capsule 0   ibuprofen (ADVIL) 800 MG tablet Take 1 tablet (800 mg total) by mouth every 8 (eight) hours as needed. 30 tablet 0   sulfamethoxazole-trimethoprim (BACTRIM DS) 800-160 MG tablet Take 1 tablet by mouth 2 (two) times daily. (Patient not taking: Reported on 03/30/2022) 14 tablet 0      levofloxacin  750 mg Oral Daily   mupirocin ointment   Topical Daily   nicotine  14 mg Transdermal Daily   polyethylene glycol  17 g Oral BID    Infusions:   ceFAZolin (ANCEF) IV 2 g (04/04/22 1043)    No Known Allergies  Social History   Socioeconomic History   Marital status: Single    Spouse name: Not on file   Number of children: Not on file   Years of education: Not on file   Highest education level: Not on file  Occupational History   Not on file  Tobacco Use   Smoking status: Every Day    Types: Cigarettes, E-cigarettes   Smokeless tobacco: Never  Vaping Use   Vaping Use: Never used  Substance and Sexual Activity   Alcohol use: Yes   Drug use: Yes    Types: Amphetamines, Heroin   Sexual activity: Not on file  Other Topics Concern   Not on file  Social History Narrative   Not on file   Social Determinants of Health   Financial Resource Strain: Not on file  Food Insecurity: No Food Insecurity (03/30/2022)   Hunger Vital  Sign    Worried About Running Out of Food in the Last Year: Never true    Ran Out of Food in the Last Year: Never true  Transportation Needs: No Transportation Needs (03/30/2022)   PRAPARE - Administrator, Civil Service (Medical): No    Lack of Transportation (Non-Medical): No  Physical Activity: Not on file  Stress: Not on file  Social Connections: Not on file  Intimate Partner Violence: Not At Risk (03/30/2022)   Humiliation, Afraid, Rape, and Kick questionnaire    Fear of Current or Ex-Partner: No    Emotionally Abused: No    Physically Abused: No    Sexually Abused: No    Family History  Problem Relation Age of Onset   Colon cancer Mother    Alcohol abuse Father     PHYSICAL EXAM: Vitals:   04/04/22 0054 04/04/22 0813  BP: 109/66 103/84  Pulse: 85 73  Resp: 17 17  Temp: 98.7 F (37.1 C) 98 F (36.7 C)  SpO2: 96% 100%     Intake/Output Summary (Last 24 hours) at 04/04/2022 1158 Last data filed at 04/04/2022 0813 Gross per 24 hour  Intake --  Output 350 ml  Net -350 ml    General:  Well appearing. No respiratory  difficulty HEENT: normal Neck: supple. no JVD. Carotids 2+ bilat; no bruits. No lymphadenopathy or thryomegaly appreciated. Cor: PMI nondisplaced. Regular rate & rhythm. No rubs, gallops or murmurs. Lungs: clear Abdomen: soft, nontender, nondistended. No hepatosplenomegaly. No bruits or masses. Good bowel sounds. Extremities: no cyanosis, clubbing, rash, edema Neuro: alert & oriented x 3, cranial nerves grossly intact. moves all 4 extremities w/o difficulty. Affect pleasant.  ECG: EKG not done  Results for orders placed or performed during the hospital encounter of 03/30/22 (from the past 24 hour(s))  Protime-INR     Status: None   Collection Time: 04/04/22  4:44 AM  Result Value Ref Range   Prothrombin Time 13.3 11.4 - 15.2 seconds   INR 1.0 0.8 - 1.2  APTT     Status: None   Collection Time: 04/04/22  4:44 AM  Result Value Ref  Range   aPTT 29 24 - 36 seconds  Ethanol     Status: None   Collection Time: 04/04/22  9:04 AM  Result Value Ref Range   Alcohol, Ethyl (B) <10 <10 mg/dL   US Abdomen Limited RUQ (LIVER/GB)  Result Date: 04/04/2022 CLINICAL DATA:  Hepatitis-C EXAM: ULTRASOUND ABDOMEN LIMITED RIGHT UPPER QUADRANT COMPARISON:  None Available. FINDINGS: Gallbladder: There is mild gallbladder distension without wall thickening or pericholecystic fluid. No intraluminal gallstones identified. Negative sonographic Murphy sign. Common bile duct: Diameter: 3.6 mm, normal.  No intrahepatic ductal dilation. Liver: No focal lesion identified. Within normal limits in parenchymal echogenicity. Portal vein is patent on color Doppler imaging with normal direction of blood flow towards the liver. Other: None. IMPRESSION: Unremarkable right upper quadrant ultrasound. Electronically Signed   By: Caprice Renshaw M.D.   On: 04/04/2022 10:37     ASSESSMENT AND PLAN: Osteomyelitis with MRSA bacteremia.  Patient was explained risk and benefits of transesophageal echocardiogram to rule out endocarditis.  Patient has agreed to the procedure which will be set up tomorrow morning.  Message was left at holding area to set it up in the morning.  I asked for 730 or 8:00 slot.  Erik Barker

## 2022-04-04 NOTE — Progress Notes (Signed)
Patient refused AM dressing change.

## 2022-04-04 NOTE — Progress Notes (Signed)
Pt vape, cigarettes, and lighter taken away from him a few days ago. Pt was looking for it today, and upset that he can't have it back. RN and charge RN advised pt to at least stay for his TEE. Unable to convince pt; AMA paperwork signed and in paper chart. Pt walked out and left AMA. MD aware and notified.

## 2022-04-05 LAB — HCV RNA QUANT
HCV Quantitative Log: 6.747 log10 IU/mL (ref >1.70)
HCV Quantitative: 5580000 IU/mL (ref >50)

## 2022-04-05 LAB — CULTURE, BLOOD (ROUTINE X 2)
Culture: NO GROWTH
Culture: NO GROWTH
Special Requests: ADEQUATE
Special Requests: ADEQUATE

## 2022-04-06 LAB — AEROBIC/ANAEROBIC CULTURE W GRAM STAIN (SURGICAL/DEEP WOUND)
Gram Stain: NONE SEEN
Gram Stain: NONE SEEN

## 2022-04-06 LAB — SURGICAL PATHOLOGY

## 2022-04-06 LAB — AFP TUMOR MARKER: AFP, Serum, Tumor Marker: 2.6 ng/mL (ref 0.0–8.4)

## 2022-04-07 LAB — HEPATITIS C GENOTYPE

## 2022-05-27 ENCOUNTER — Other Ambulatory Visit: Payer: Self-pay

## 2022-05-27 ENCOUNTER — Inpatient Hospital Stay
Admission: EM | Admit: 2022-05-27 | Discharge: 2022-06-02 | DRG: 478 | Discharge status: Against Medical Advice | Payer: Medicare HMO | Attending: Internal Medicine | Admitting: Internal Medicine

## 2022-05-27 ENCOUNTER — Encounter: Payer: Self-pay | Admitting: Internal Medicine

## 2022-05-27 ENCOUNTER — Emergency Department: Payer: Medicare HMO

## 2022-05-27 DIAGNOSIS — F17290 Nicotine dependence, other tobacco product, uncomplicated: Secondary | ICD-10-CM | POA: Diagnosis present

## 2022-05-27 DIAGNOSIS — Z5329 Procedure and treatment not carried out because of patient's decision for other reasons: Secondary | ICD-10-CM | POA: Diagnosis present

## 2022-05-27 DIAGNOSIS — M86171 Other acute osteomyelitis, right ankle and foot: Secondary | ICD-10-CM | POA: Diagnosis present

## 2022-05-27 DIAGNOSIS — B9561 Methicillin susceptible Staphylococcus aureus infection as the cause of diseases classified elsewhere: Secondary | ICD-10-CM | POA: Diagnosis present

## 2022-05-27 DIAGNOSIS — F1910 Other psychoactive substance abuse, uncomplicated: Secondary | ICD-10-CM | POA: Diagnosis present

## 2022-05-27 DIAGNOSIS — F17210 Nicotine dependence, cigarettes, uncomplicated: Secondary | ICD-10-CM | POA: Diagnosis present

## 2022-05-27 DIAGNOSIS — Z5309 Procedure and treatment not carried out because of other contraindication: Secondary | ICD-10-CM | POA: Diagnosis present

## 2022-05-27 DIAGNOSIS — B1920 Unspecified viral hepatitis C without hepatic coma: Secondary | ICD-10-CM | POA: Diagnosis present

## 2022-05-27 DIAGNOSIS — M00071 Staphylococcal arthritis, right ankle and foot: Secondary | ICD-10-CM | POA: Diagnosis present

## 2022-05-27 DIAGNOSIS — Z811 Family history of alcohol abuse and dependence: Secondary | ICD-10-CM

## 2022-05-27 DIAGNOSIS — Z800 Family history of malignant neoplasm of digestive organs: Secondary | ICD-10-CM

## 2022-05-27 DIAGNOSIS — F1410 Cocaine abuse, uncomplicated: Secondary | ICD-10-CM | POA: Diagnosis present

## 2022-05-27 DIAGNOSIS — L03119 Cellulitis of unspecified part of limb: Principal | ICD-10-CM

## 2022-05-27 DIAGNOSIS — L02611 Cutaneous abscess of right foot: Secondary | ICD-10-CM | POA: Diagnosis present

## 2022-05-27 DIAGNOSIS — M009 Pyogenic arthritis, unspecified: Secondary | ICD-10-CM | POA: Diagnosis present

## 2022-05-27 DIAGNOSIS — B182 Chronic viral hepatitis C: Secondary | ICD-10-CM

## 2022-05-27 LAB — URINALYSIS, ROUTINE W REFLEX MICROSCOPIC
Bilirubin Urine: NEGATIVE
Glucose, UA: NEGATIVE mg/dL
Hgb urine dipstick: NEGATIVE
Ketones, ur: NEGATIVE mg/dL
Leukocytes,Ua: NEGATIVE
Nitrite: NEGATIVE
Protein, ur: NEGATIVE mg/dL
Specific Gravity, Urine: 1.031 — ABNORMAL HIGH (ref 1.005–1.030)
pH: 7 (ref 5.0–8.0)

## 2022-05-27 LAB — CBC
HCT: 43.3 % (ref 39.0–52.0)
Hemoglobin: 13.8 g/dL (ref 13.0–17.0)
MCH: 28.5 pg (ref 26.0–34.0)
MCHC: 31.9 g/dL (ref 30.0–36.0)
MCV: 89.5 fL (ref 80.0–100.0)
Platelets: 302 10*3/uL (ref 150–400)
RBC: 4.84 MIL/uL (ref 4.22–5.81)
RDW: 14.7 % (ref 11.5–15.5)
WBC: 8 10*3/uL (ref 4.0–10.5)
nRBC: 0 % (ref 0.0–0.2)

## 2022-05-27 LAB — LACTIC ACID, PLASMA
Lactic Acid, Venous: 2.1 mmol/L (ref 0.5–1.9)
Lactic Acid, Venous: 1.3 mmol/L (ref 0.5–1.9)

## 2022-05-27 LAB — BASIC METABOLIC PANEL
Anion gap: 13 (ref 5–15)
BUN: 15 mg/dL (ref 6–20)
CO2: 21 mmol/L — ABNORMAL LOW (ref 22–32)
Calcium: 8.6 mg/dL — ABNORMAL LOW (ref 8.9–10.3)
Chloride: 100 mmol/L (ref 98–111)
Creatinine, Ser: 0.87 mg/dL (ref 0.61–1.24)
GFR, Estimated: >60 mL/min (ref >60)
Glucose, Bld: 144 mg/dL — ABNORMAL HIGH (ref 70–99)
Potassium: 3.3 mmol/L — ABNORMAL LOW (ref 3.5–5.1)
Sodium: 134 mmol/L — ABNORMAL LOW (ref 135–145)

## 2022-05-27 LAB — SEDIMENTATION RATE: Sed Rate: 30 mm/hr — ABNORMAL HIGH (ref 0–20)

## 2022-05-27 LAB — C-REACTIVE PROTEIN: CRP: 5.8 mg/dL — ABNORMAL HIGH (ref <1.0)

## 2022-05-27 MED ORDER — SODIUM CHLORIDE 0.9% FLUSH
3.0000 mL | Freq: Two times a day (BID) | INTRAVENOUS | Status: DC
  Administered 2022-05-28 – 2022-06-01 (×7): 3 mL via INTRAVENOUS

## 2022-05-27 MED ORDER — ONDANSETRON HCL 4 MG/2ML IJ SOLN: 4.0000 mg | Freq: Four times a day (QID) | INTRAMUSCULAR | Status: DC | PRN

## 2022-05-27 MED ORDER — MORPHINE SULFATE (PF) 4 MG/ML IV SOLN
4.0000 mg | Freq: Once | INTRAVENOUS | Status: AC
  Administered 2022-05-27: 4 mg via INTRAVENOUS
  Filled 2022-05-27: qty 1

## 2022-05-27 MED ORDER — LACTATED RINGERS IV BOLUS
1000.0000 mL | Freq: Once | INTRAVENOUS | Status: AC
  Administered 2022-05-27: 1000 mL via INTRAVENOUS

## 2022-05-27 MED ORDER — IOHEXOL 300 MG/ML  SOLN
100.0000 mL | Freq: Once | INTRAMUSCULAR | Status: AC | PRN
  Administered 2022-05-27: 100 mL via INTRAVENOUS

## 2022-05-27 MED ORDER — VANCOMYCIN HCL IN DEXTROSE 1-5 GM/200ML-% IV SOLN
1000.0000 mg | Freq: Once | INTRAVENOUS | Status: AC
  Administered 2022-05-27: 1000 mg via INTRAVENOUS
  Filled 2022-05-27: qty 200

## 2022-05-27 MED ORDER — SODIUM CHLORIDE 0.9 % IV SOLN: 1.0000 g | Freq: Once | INTRAVENOUS | Status: DC

## 2022-05-27 MED ORDER — HEPARIN SODIUM (PORCINE) 5000 UNIT/ML IJ SOLN
5000.0000 [IU] | Freq: Three times a day (TID) | INTRAMUSCULAR | Status: AC
  Administered 2022-05-27: 5000 [IU] via SUBCUTANEOUS
  Filled 2022-05-27: qty 1

## 2022-05-27 MED ORDER — POLYETHYLENE GLYCOL 3350 17 G PO PACK: 17.0000 g | PACK | Freq: Every day | ORAL | Status: DC | PRN

## 2022-05-27 MED ORDER — SODIUM CHLORIDE 0.9 % IV SOLN: INTRAVENOUS | Status: DC | PRN

## 2022-05-27 MED ORDER — ONDANSETRON HCL 4 MG PO TABS: 4.0000 mg | ORAL_TABLET | Freq: Four times a day (QID) | ORAL | Status: DC | PRN

## 2022-05-27 MED ORDER — ACETAMINOPHEN 650 MG RE SUPP: 650.0000 mg | Freq: Four times a day (QID) | RECTAL | Status: DC | PRN

## 2022-05-27 MED ORDER — VANCOMYCIN HCL 750 MG/150ML IV SOLN
750.0000 mg | Freq: Two times a day (BID) | INTRAVENOUS | Status: DC
  Administered 2022-05-27 – 2022-05-28 (×2): 750 mg via INTRAVENOUS
  Filled 2022-05-27 (×2): qty 150

## 2022-05-27 MED ORDER — HYDROMORPHONE HCL 1 MG/ML IJ SOLN
1.0000 mg | INTRAMUSCULAR | Status: DC | PRN
  Administered 2022-05-29 – 2022-06-02 (×11): 1 mg via INTRAVENOUS
  Filled 2022-05-27 (×11): qty 1

## 2022-05-27 MED ORDER — METRONIDAZOLE 500 MG/100ML IV SOLN
500.0000 mg | Freq: Two times a day (BID) | INTRAVENOUS | Status: DC
  Administered 2022-05-27 – 2022-05-28 (×2): 500 mg via INTRAVENOUS
  Filled 2022-05-27 (×4): qty 100

## 2022-05-27 MED ORDER — ACETAMINOPHEN 325 MG PO TABS: 650.0000 mg | ORAL_TABLET | Freq: Four times a day (QID) | ORAL | Status: DC | PRN

## 2022-05-27 MED ORDER — SODIUM CHLORIDE 0.9 % IV SOLN
1.0000 g | Freq: Once | INTRAVENOUS | Status: AC
  Administered 2022-05-27: 1 g via INTRAVENOUS
  Filled 2022-05-27: qty 10

## 2022-05-27 MED ORDER — HYDROCODONE-ACETAMINOPHEN 5-325 MG PO TABS
1.0000 | ORAL_TABLET | ORAL | Status: DC | PRN
  Administered 2022-05-27 – 2022-05-28 (×3): 2 via ORAL
  Administered 2022-05-28: 1 via ORAL
  Administered 2022-05-28 – 2022-06-02 (×22): 2 via ORAL
  Filled 2022-05-27 (×20): qty 2
  Filled 2022-05-27: qty 1
  Filled 2022-05-27 (×5): qty 2

## 2022-05-27 MED ORDER — SODIUM CHLORIDE 0.9 % IV SOLN
2.0000 g | Freq: Three times a day (TID) | INTRAVENOUS | Status: DC
  Administered 2022-05-27 – 2022-06-02 (×17): 2 g via INTRAVENOUS
  Filled 2022-05-27: qty 2
  Filled 2022-05-27 (×3): qty 12.5
  Filled 2022-05-27: qty 2
  Filled 2022-05-27 (×3): qty 12.5
  Filled 2022-05-27: qty 2
  Filled 2022-05-27 (×3): qty 12.5
  Filled 2022-05-27 (×2): qty 2
  Filled 2022-05-27 (×3): qty 12.5
  Filled 2022-05-27: qty 2
  Filled 2022-05-27: qty 12.5

## 2022-05-27 NOTE — Assessment & Plan Note (Signed)
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  Patient is afebrile at this time and hemodynamically stable with no evidence of sepsis.  Given recent IV drug use, will cover for MRSA although prior cultures only grew MSSA.   - Podiatry consulted; appreciate their recommendations - N.p.o. after midnight - Continue vancomycin and cefepime - Add anaerobic coverage with Flagyl - Norco and Dilaudid for pain control - Blood cultures pending

## 2022-05-27 NOTE — ED Provider Notes (Signed)
Patient signed out to me pending CT results.  CT shows concerning findings for right TMT septic arthritis and developing abscess.  I have messaged podiatry and they are aware will see the patient.  He is already receiving antibiotics.  Will need admission patient is amenable to staying.   Rada Hay, MD 05/27/22 [phone]

## 2022-05-27 NOTE — Assessment & Plan Note (Signed)
Patient states that he uses a variety of substances, generally when he is with his friends.  Most recent intravenous use was approximately 2 weeks ago. Urine toxicology still positive for cocaine.  Surgery canceled for today.

## 2022-05-27 NOTE — ED Provider Notes (Signed)
[facility] Provider Note    Event Date/Time   First MD Initiated Contact with Patient 05/27/22 1321     (approximate)   History   Chief Complaint Foot Pain   HPI  Erik Barker is a 52 y.o. male with past medical history of IV drug use and hepatitis C who presents to the ED complaining of foot pain.  Patient reports that he has had about 1 week of increasing redness, pain, and swelling to his left foot.  He reports being previously admitted to the hospital for this with infection going into his bone.  He denies any fevers and states he has otherwise been feeling well, but is beginning to have difficulty bearing weight on his right foot.  He does admit to ongoing IV drug use, with his last use about 1 week ago.     Physical Exam   Triage Vital Signs: ED Triage Vitals  Enc Vitals Group     BP 05/27/22 1224 123/81     Pulse Rate 05/27/22 1224 92     Resp 05/27/22 1224 16     Temp 05/27/22 1224 (!) 97.5 F (36.4 C)     Temp Source 05/27/22 1224 Oral     SpO2 05/27/22 1224 100 %     Weight 05/27/22 1224 165 lb (74.8 kg)     Height 05/27/22 1224 5' 11"$  (1.803 m)     Head Circumference --      Peak Flow --      Pain Score 05/27/22 1220 7     Pain Loc --      Pain Edu? --      Excl. in [place]? --     Most recent vital signs: Vitals:   05/27/22 1224  BP: 123/81  Pulse: 92  Resp: 16  Temp: (!) 97.5 F (36.4 C)  SpO2: 100%    Constitutional: Alert and oriented. Eyes: Conjunctivae are normal. Head: Atraumatic. Nose: No congestion/rhinnorhea. Mouth/Throat: Mucous membranes are moist.  Cardiovascular: Normal rate, regular rhythm. Grossly normal heart sounds.  2+ radial pulses bilaterally. Respiratory: Normal respiratory effort.  No retractions. Lungs CTAB. Gastrointestinal: Soft and nontender. No distention. Musculoskeletal: Erythema, edema, warmth, and diffuse tenderness circumferentially around right foot extending to the area of the ankle.   Prior surgical site without drainage. Neurologic:  Normal speech and language. No gross focal neurologic deficits are appreciated.    ED Results / Procedures / Treatments   Labs (all labs ordered are listed, but only abnormal results are displayed) Labs Reviewed  BASIC METABOLIC PANEL - Abnormal; Notable for the following components:      Result Value   Sodium 134 (*)    Potassium 3.3 (*)    CO2 21 (*)    Glucose, Bld 144 (*)    Calcium 8.6 (*)    All other components within normal limits  LACTIC ACID, PLASMA - Abnormal; Notable for the following components:   Lactic Acid, Venous 2.1 (*)    All other components within normal limits  URINALYSIS, ROUTINE W REFLEX MICROSCOPIC - Abnormal; Notable for the following components:   Color, Urine YELLOW (*)    APPearance CLOUDY (*)    Specific Gravity, Urine 1.031 (*)    All other components within normal limits  SEDIMENTATION RATE - Abnormal; Notable for the following components:   Sed Rate 30 (*)    All other components within normal limits  CULTURE, BLOOD (ROUTINE X 2)  CULTURE, BLOOD (ROUTINE X 2)  CBC  LACTIC ACID, PLASMA  C-REACTIVE PROTEIN   RADIOLOGY CT foot reviewed and interpreted by me with inflammatory changes concerning for cellulitis.  PROCEDURES:  Critical Care performed: No  Procedures   MEDICATIONS ORDERED IN ED: Medications  vancomycin (VANCOCIN) IVPB 1000 mg/200 mL premix (0 mg Intravenous Stopped 05/27/22 1505)  morphine (PF) 4 MG/ML injection 4 mg (4 mg Intravenous Given 05/27/22 1402)  ceFEPIme (MAXIPIME) 1 g in sodium chloride 0.9 % 100 mL IVPB (0 g Intravenous Stopped 05/27/22 1434)  lactated ringers bolus 1,000 mL (0 mLs Intravenous Stopped 05/27/22 1512)  iohexol (OMNIPAQUE) 300 MG/ML solution 100 mL (100 mLs Intravenous Contrast Given 05/27/22 1455)     IMPRESSION / MDM / ASSESSMENT AND PLAN / ED COURSE  I reviewed the triage vital signs and the nursing notes.                              52  y.o. male with past medical history of IV drug use and hepatitis C who presents to the ED complaining of 1 week of increasing pain and swelling in his right foot following recent admission for septic arthritis and osteomyelitis.  Patient's presentation is most consistent with acute presentation with potential threat to life or bodily function.  Differential diagnosis includes, but is not limited to, septic arthritis, osteomyelitis, abscess, cellulitis, sepsis, bacteremia.  Patient nontoxic-appearing and in no acute distress, vital signs are reassuring and do not appear concerning for sepsis.  He does have obvious cellulitis to his right foot, previously found to have septic arthritis and osteomyelitis, had bone biopsy performed by podiatry but signed out AMA from the hospital prior to completion of treatment.  He has obvious recurrence of cellulitis, will further assess with CT imaging for abscess, osteomyelitis, or septic arthritis.  We will start him on cefepime and vancomycin, treat pain with IV morphine.  Labs without significant anemia, leukocytosis, electrolyte abnormality, or AKI.  He does have mildly elevated lactic acid which we will trend, blood cultures are pending at this time.  Patient turned over to oncoming provider pending CT results and admission.      FINAL CLINICAL IMPRESSION(S) / ED DIAGNOSES   Final diagnoses:  Cellulitis of foot     Rx / DC Orders   ED Discharge Orders     None        Note:  This document was prepared using Dragon voice recognition software and may include unintentional dictation errors.   Blake Divine, MD 05/27/22 1535
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
## 2022-05-27 NOTE — Assessment & Plan Note (Signed)
Osteomyelitis right foot.  Also seen on prior CT scans.

## 2022-05-27 NOTE — ED Notes (Signed)
Pt given sandwich tray and beverage.

## 2022-05-27 NOTE — Consult Note (Signed)
Pharmacy Antibiotic Note  Erik Barker is a 52 y.o. male admitted on 05/27/2022 with R foot pain x several days. Patient with PMH of polysubstance abuse including IVDA. Patient recently admitted 12/20-12/25/23 for osteomyelitis of R ankle and MSSA bacteremia 02/2022; however patient left AMA prior to TEE and was discharged on PO keflex 1 gram QID x 4 weeks.  Pharmacy has been consulted for cefepime and vancomycin dosing.  Plan: Continue metronidazole 500 IV Q12H Initiate cefepime 2 gram Q8H Vancomycin 1000 mg x 1 given in ED.  Initiate Vancomycin 750 mg Q12H. Goal AUC 400-600 Estimated AUC 426/Cmin: 12.5 Scr 0.87, IBW, Vd 0.72   Height: 5' 11"$  (180.3 cm) Weight: 74.8 kg (165 lb) IBW/kg (Calculated) : 75.3  Temp (24hrs), Avg:97.7 F (36.5 C), Min:97.5 F (36.4 C), Max:98 F (36.7 C)  Recent Labs  Lab 05/27/22 1300 05/27/22 1553  WBC 8.0  --   CREATININE 0.87  --   LATICACIDVEN 2.1* 1.3    Estimated Creatinine Clearance: 105.1 mL/min (by C-G formula based on SCr of 0.87 mg/dL).    No Known Allergies  Antimicrobials this admission: 2/16 vancomycin >>  2/16 cefepime >>  2/16 metronidazole  Dose adjustments this admission:   Microbiology results: 2/16 BCx: sent   Thank you for allowing pharmacy to be a part of this patient's care.  Dorothe Pea, PharmD, BCPS Clinical Pharmacist   05/27/2022 5:06 PM

## 2022-05-27 NOTE — ED Notes (Signed)
Pt to CT

## 2022-05-27 NOTE — ED Triage Notes (Addendum)
Pt comes with c/o right foot pain for several days. Pt states he had surgery on foot month ago. Pt states swelling, redness and pain with wound present.   Doc called and concerned for hardware infection.
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
## 2022-05-27 NOTE — Assessment & Plan Note (Addendum)
On last admission, hepatitis C viral load was elevated.  Patient is interested in treatment but must follow-up.

## 2022-05-28 ENCOUNTER — Encounter: Payer: Self-pay | Admitting: Anesthesiology

## 2022-05-28 LAB — CBC WITH DIFFERENTIAL/PLATELET
Abs Immature Granulocytes: 0.01 10*3/uL (ref 0.00–0.07)
Basophils Absolute: 0 10*3/uL (ref 0.0–0.1)
Basophils Relative: 0 %
Eosinophils Absolute: 0.2 10*3/uL (ref 0.0–0.5)
Eosinophils Relative: 3 %
HCT: 39.9 % (ref 39.0–52.0)
Hemoglobin: 13 g/dL (ref 13.0–17.0)
Immature Granulocytes: 0 %
Lymphocytes Relative: 19 %
Lymphs Abs: 1.3 10*3/uL (ref 0.7–4.0)
MCH: 28.8 pg (ref 26.0–34.0)
MCHC: 32.6 g/dL (ref 30.0–36.0)
MCV: 88.3 fL (ref 80.0–100.0)
Monocytes Absolute: 0.5 10*3/uL (ref 0.1–1.0)
Monocytes Relative: 7 %
Neutro Abs: 4.8 10*3/uL (ref 1.7–7.7)
Neutrophils Relative %: 71 %
Platelets: 288 10*3/uL (ref 150–400)
RBC: 4.52 MIL/uL (ref 4.22–5.81)
RDW: 14.8 % (ref 11.5–15.5)
WBC: 6.8 10*3/uL (ref 4.0–10.5)
nRBC: 0 % (ref 0.0–0.2)

## 2022-05-28 LAB — URINE DRUG SCREEN, QUALITATIVE (ARMC ONLY)
Amphetamines, Ur Screen: POSITIVE — AB
Barbiturates, Ur Screen: NOT DETECTED
Benzodiazepine, Ur Scrn: NOT DETECTED
Cannabinoid 50 Ng, Ur ~~LOC~~: NOT DETECTED
Cocaine Metabolite,Ur ~~LOC~~: POSITIVE — AB
MDMA (Ecstasy)Ur Screen: NOT DETECTED
Methadone Scn, Ur: NOT DETECTED
Opiate, Ur Screen: POSITIVE — AB
Phencyclidine (PCP) Ur S: NOT DETECTED
Tricyclic, Ur Screen: NOT DETECTED

## 2022-05-28 LAB — COMPREHENSIVE METABOLIC PANEL
ALT: 40 U/L (ref 0–44)
AST: 41 U/L (ref 15–41)
Albumin: 3 g/dL — ABNORMAL LOW (ref 3.5–5.0)
Alkaline Phosphatase: 78 U/L (ref 38–126)
Anion gap: 7 (ref 5–15)
BUN: 11 mg/dL (ref 6–20)
CO2: 23 mmol/L (ref 22–32)
Calcium: 8.3 mg/dL — ABNORMAL LOW (ref 8.9–10.3)
Chloride: 106 mmol/L (ref 98–111)
Creatinine, Ser: 0.76 mg/dL (ref 0.61–1.24)
GFR, Estimated: >60 mL/min (ref >60)
Glucose, Bld: 159 mg/dL — ABNORMAL HIGH (ref 70–99)
Potassium: 3.8 mmol/L (ref 3.5–5.1)
Sodium: 136 mmol/L (ref 135–145)
Total Bilirubin: 0.6 mg/dL (ref 0.3–1.2)
Total Protein: 6.5 g/dL (ref 6.5–8.1)

## 2022-05-28 MED ORDER — VANCOMYCIN HCL 1250 MG/250ML IV SOLN
1250.0000 mg | Freq: Two times a day (BID) | INTRAVENOUS | Status: DC
  Administered 2022-05-28 – 2022-06-02 (×10): 1250 mg via INTRAVENOUS
  Filled 2022-05-28 (×10): qty 250

## 2022-05-28 MED ORDER — ENOXAPARIN SODIUM 40 MG/0.4ML IJ SOSY
40.0000 mg | PREFILLED_SYRINGE | INTRAMUSCULAR | Status: DC
  Administered 2022-05-28 – 2022-06-01 (×5): 40 mg via SUBCUTANEOUS
  Filled 2022-05-28 (×5): qty 0.4

## 2022-05-28 NOTE — Consult Note (Signed)
Pharmacy Antibiotic Note  Erik Barker is a 52 y.o. male admitted on 05/27/2022 with R foot pain x several days. Patient with PMH of polysubstance abuse including IVDA. Patient recently admitted 12/20-12/25/23 for osteomyelitis of R ankle and MSSA bacteremia 02/2022; however patient left AMA prior to TEE and was discharged on PO keflex 1 gram QID x 4 weeks.  Pharmacy has been consulted for cefepime and vancomycin dosing.  Plan:  1) continue cefepime 2 grams IV Q8H  2) adjust vancomycin to 1250 mg Q12H. Goal AUC 400-600 Estimated AUC 465.2/Cmin: 11.7 Scr 0.80 mg/dL (rounded up), Vd 0.72  Ke 0.100 h-1, T1/2 7.0h  Height: 5' 11"$  (180.3 cm) Weight: 74.8 kg (165 lb) IBW/kg (Calculated) : 75.3  Temp (24hrs), Avg:98 F (36.7 C), Min:97.5 F (36.4 C), Max:98.8 F (37.1 C)  Recent Labs  Lab 05/27/22 1300 05/27/22 1553  WBC 8.0  --   CREATININE 0.87  --   LATICACIDVEN 2.1* 1.3     Estimated Creatinine Clearance: 105.1 mL/min (by C-G formula based on SCr of 0.87 mg/dL).    No Known Allergies  Antimicrobials this admission: 2/16 vancomycin >>  2/16 cefepime >>  2/16 metronidazole >>  Microbiology results: 2/16 BCx: NGTD   Thank you for allowing pharmacy to be a part of this patient's care.  Dallie Piles, PharmD, BCPS Clinical Pharmacist   05/28/2022 9:02 AM

## 2022-05-28 NOTE — TOC Initial Note (Signed)
Transition of Care [facility]) - Initial/Assessment Note    Patient Details  Name: Erik Barker MRN: [id number] Date of Birth: 12-14-1970  [facility] CM/SW Contact:    Harriet Masson, RN Phone Number: [phone] 05/28/2022, 3:14 PM  Clinical Narrative:                 Spoke with pt today concerning substance abuse resources as recommended. Pt states he has supportive family and a safe place to go along with the ability to afford his medications. Pt verified sufficient transportation to his follow up appointments and upon his discharge. Offered substance abuse information/resources-pt declined indicating he will start [facility] substance abuse program (classes) this month here in [place]. No further request or needs at this time.  TOC will continue to follow for any additional needs.  Expected Discharge Plan: Home/Self Care Barriers to Discharge: Continued Medical Work up   Patient Goals and CMS Choice            Expected Discharge Plan and Services   Discharge Planning Services: CM Consult   Living arrangements for the past 2 months: Single Family Home                                      Prior Living Arrangements/Services Living arrangements for the past 2 months: Single Family Home Lives with:: Self Patient language and need for interpreter reviewed:: Yes        Need for Family Participation in Patient Care: Yes (Comment) Care giver support system in place?: Yes (comment)      Activities of Daily Living Home Assistive Devices/Equipment: None ADL Screening (condition at time of admission) Patient's cognitive ability adequate to safely complete daily activities?: Yes Is the patient deaf or have difficulty hearing?: No Does the patient have difficulty seeing, even when wearing glasses/contacts?: No Does the patient have difficulty concentrating, remembering, or making decisions?: No Patient able to express need for assistance with ADLs?:  Yes Does the patient have difficulty dressing or bathing?: No Independently performs ADLs?: Yes (appropriate for developmental age) Does the patient have difficulty walking or climbing stairs?: No Weakness of Legs: Right Weakness of Arms/Hands: None  Permission Sought/Granted   Permission granted to share information with : Yes, Verbal Permission Granted              Emotional Assessment Appearance:: Appears stated age Attitude/Demeanor/Rapport: Engaged Affect (typically observed): Accepting Orientation: : Oriented to Self, Oriented to Place, Oriented to  Time, Oriented to Situation   Psych Involvement: No (comment)  Admission diagnosis:  Septic arthritis ([place]) [M00.9] Cellulitis of foot [L03.119] Patient Active Problem List   Diagnosis Date Noted   Septic arthritis ([place]) 05/27/2022   Osteomyelitis of right foot ([place]) 04/03/2022   Hyponatremia 04/02/2022   Hepatitis C 04/01/2022   MSSA bacteremia 03/31/2022   Osteomyelitis of ankle or foot, acute, right ([place]) 03/30/2022   Septic arthritis of foot ([place]) 03/30/2022   Polysubstance abuse ([place]) 03/30/2022   Cellulitis 03/10/2022   Heroin use 03/10/2022   Tobacco abuse 03/10/2022   Elevated liver function tests 03/10/2022   Tobacco abuse counseling 03/10/2022   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   Methamphetamine abuse ([place]) 07/23/2020   Alcohol abuse 07/23/2020   PCP:  Merryl Hacker, No Pharmacy:   [id number] [place] [place] [id number]: [phone]  Fax: [phone]     Social Determinants of Health (SDOH) Social History: SDOH Screenings   Food Insecurity: No Food Insecurity (05/27/2022)  Housing: Medium Risk (05/27/2022)  Transportation Needs: No Transportation Needs (05/27/2022)  Utilities: Not At Risk (05/27/2022)  Tobacco Use: High Risk (05/27/2022)   SDOH Interventions:     Readmission Risk Interventions     No  data to display

## 2022-05-28 NOTE — Progress Notes (Signed)
[place] [facility] [place] NAME: Erik Barker    MR#:  [id number]  DATE OF BIRTH:  03-May-1970  SUBJECTIVE:  came in with ongoing pain and swelling in the right foot. Patient left AMA in December although does tell me he completed his antibiotic. [employer] for his work requirement. Family at bedside.    VITALS:  Blood pressure 113/75, pulse 64, temperature 98.2 F (36.8 C), resp. rate 17, height 5' 11"$  (1.803 m), weight 74.8 kg, SpO2 99 %.  PHYSICAL EXAMINATION:   GENERAL:  52 y.o.-year-old patient with no acute distress.  LUNGS: Normal breath sounds bilaterally, no wheezing CARDIOVASCULAR: S1, S2 normal. No murmur   ABDOMEN: Soft, nontender, nondistended. Bowel sounds present.  EXTREMITIES:  NEUROLOGIC: nonfocal  patient is alert and awake SKIN: as above  LABORATORY PANEL:  CBC Recent Labs  Lab 05/28/22 0915  WBC 6.8  HGB 13.0  HCT 39.9  PLT 288    Chemistries  Recent Labs  Lab 05/28/22 0915  NA 136  K 3.8  CL 106  CO2 23  GLUCOSE 159*  BUN 11  CREATININE 0.76  CALCIUM 8.3*  AST 41  ALT 40  ALKPHOS 78  BILITOT 0.6   Cardiac Enzymes No results for input(s): "TROPONINI" in the last 168 hours. RADIOLOGY:  CT FOOT RIGHT W CONTRAST  Result Date: 05/27/2022 CLINICAL DATA:  Soft tissue infection suspected, foot, no prior imaging EXAM: CT OF THE LOWER RIGHT EXTREMITY WITH CONTRAST TECHNIQUE: Multidetector CT imaging of the lower right extremity was performed according to the standard protocol following intravenous contrast administration. RADIATION DOSE REDUCTION: This exam was performed according to the departmental dose-optimization program which includes automated exposure control, adjustment of the mA and/or kV according to patient size and/or use of iterative reconstruction technique. CONTRAST:  174m OMNIPAQUE IOHEXOL 300 MG/ML  SOLN COMPARISON:  CT 03/30/2022 FINDINGS: Bones/Joint/Cartilage There are  erosive changes at the first tarsometatarsal joint with developing bony fragmentation, progressed from prior exam, with increased sclerosis periosteal reaction along the proximal first metatarsal shaft. Unchanged tibialis anterior avulsion fracture. 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 Ligaments Suboptimally assessed by CT. Muscles and Tendons Unchanged tibialis anterior avulsion avulsion fracture with distal tendon thickening and hypodensity. Soft tissues There is a rim enhancing collection adjacent to the medial aspect of the first TMT joint (series 100 images 178-195). This area was previously ill-defined and not fluid density, this is likely an abscess and measures 1.6 x 0.6 x 1.9 cm (series 10, image 144, series 100, image 184). IMPRESSION: Evolving septic arthritis and periarticular osteomyelitis of the first TMT joint with bony erosion and new fluid collection suspicious for abscess along the medial aspect of the joint measuring 1.6 x 0.6 x 1.9 cm. Electronically Signed   By: JMaurine SimmeringM.D.   On: 05/27/2022 15:34    Assessment and Plan  Erik BORRIELLOis a 52y.o. male with medical history significant of polysubstance abuse, hepatitis C, recent osteomyelitis complicated by MSSA bacteremia with incomplete treatment (December 2023) who presents to the ED due to foot pain.  Pt states that his foot began to swell and turn red approximately 10 days ago.  Since then, it has been gradually worsening.  He has noticed occasional drainage from the bottom from a wound that initially occurred in November 2023.   In addition, he had not followed up with any physician after his most recent hospitalization in December 2023 and he tried  to remove his stitches himself but he could not get them all out.   CT of the foot was obtained that demonstrated evolving septic arthritis and periarticular osteomyelitis of the first TMT joint, in addition to a new developing abscess.   Septic arthritis  [facility]) Patient presenting with 10-day history of worsening right foot erythema, edema and pain with CT imaging demonstrating septic arthritis of the first TMT joint.  Patient is afebrile at this time and hemodynamically stable with no evidence of sepsis.  Given recent IV drug use, will cover for MRSA although prior cultures only grew MSSA.  - Podiatry consulted--plans for I and D tomorrow - N.p.o. after midnight - Continue vancomycin and cefepime - Norco and Dilaudid for pain control - Blood cultures negative --consult ID on Monday for help with abxs   Osteomyelitis of ankle or foot, acute, right ([place]) CT obtained of the foot earlier also demonstrates osteomyelitis surrounding area of septic arthritis.   - Podiatry consulted; appreciate their recommendations - Please see above for management of both osteo and septic arthritis   Hepatitis C On last admission, hepatitis C viral load was elevated.  Patient is interested in treatment and is willing to follow-up with ID.   --Please refer to ID prior to discharge   Polysubstance abuse [facility]) Patient states that he uses a variety of substances, generally when he is with his friends.  Most recent intravenous use was approximately 2 weeks ago.  Procedures: Family communication :none Consults :Podiatry CODE STATUS: full DVT Prophylaxis :lovenox Level of care: Med-Surg Status is: Inpatient Remains inpatient appropriate because: right foot abscess and OM    TOTAL TIME TAKING CARE OF THIS PATIENT: 35 minutes.  >50% time spent on counselling and coordination of care  Note: This dictation was prepared with Dragon dictation along with smaller phrase technology. Any transcriptional errors that result from this process are unintentional.  Erik Barker M.D    [facility]   CC: Primary care physician; Pcp, No

## 2022-05-29 ENCOUNTER — Encounter
Admission: EM | Discharge status: Against Medical Advice | Payer: Self-pay | Source: Home / Self Care | Attending: Internal Medicine

## 2022-05-29 LAB — CREATININE, SERUM
Creatinine, Ser: 0.86 mg/dL (ref 0.61–1.24)
GFR, Estimated: >60 mL/min (ref >60)

## 2022-05-29 SURGERY — IRRIGATION AND DEBRIDEMENT FOOT
Anesthesia: Monitor Anesthesia Care | Laterality: Right

## 2022-05-29 SURGICAL SUPPLY — 50 items
BAG COUNTER SPONGE SURGICOUNT (BAG) ×1 IMPLANT
BLADE OSC/SAGITTAL MD 5.5X18 (BLADE) IMPLANT
BLADE OSCILLATING/SAGITTAL (BLADE)
BLADE SW THK.38XMED LNG THN (BLADE) IMPLANT
BNDG ELASTIC 3X5.8 VLCR NS LF (GAUZE/BANDAGES/DRESSINGS) IMPLANT
BNDG ELASTIC 4X5.8 VLCR NS LF (GAUZE/BANDAGES/DRESSINGS) IMPLANT
BNDG ELASTIC 6X5.8 VLCR NS LF (GAUZE/BANDAGES/DRESSINGS) ×1 IMPLANT
BNDG ESMARCH 4 X 12 STRL LF (GAUZE/BANDAGES/DRESSINGS) ×1
BNDG ESMARCH 4X12 STRL LF (GAUZE/BANDAGES/DRESSINGS) ×1 IMPLANT
BNDG GAUZE DERMACEA FLUFF 4 (GAUZE/BANDAGES/DRESSINGS) IMPLANT
BNDG STRETCH GAUZE 3IN X12FT (GAUZE/BANDAGES/DRESSINGS) IMPLANT
DURAPREP 26ML APPLICATOR (WOUND CARE) ×1 IMPLANT
ELECT REM PT RETURN 9FT ADLT (ELECTROSURGICAL) ×1
ELECTRODE REM PT RTRN 9FT ADLT (ELECTROSURGICAL) ×1 IMPLANT
GAUZE PACKING 0.25INX5YD STRL (GAUZE/BANDAGES/DRESSINGS) IMPLANT
GAUZE SPONGE 4X4 12PLY STRL (GAUZE/BANDAGES/DRESSINGS) ×1 IMPLANT
GAUZE STRETCH 2X75IN STRL (MISCELLANEOUS) IMPLANT
GAUZE XEROFORM 1X8 LF (GAUZE/BANDAGES/DRESSINGS) ×1 IMPLANT
GLOVE BIO SURGEON STRL SZ7.5 (GLOVE) ×2 IMPLANT
GOWN STRL REUS W/ TWL LRG LVL3 (GOWN DISPOSABLE) ×2 IMPLANT
GOWN STRL REUS W/TWL LRG LVL3 (GOWN DISPOSABLE) ×2
IV NS 1000ML (IV SOLUTION)
IV NS 1000ML BAXH (IV SOLUTION) IMPLANT
IV NS IRRIG 3000ML ARTHROMATIC (IV SOLUTION) IMPLANT
KIT TURNOVER KIT A (KITS) ×1 IMPLANT
LABEL OR SOLS (LABEL) IMPLANT
MANIFOLD NEPTUNE II (INSTRUMENTS) ×1 IMPLANT
NEEDLE HYPO 25X1 1.5 SAFETY (NEEDLE) ×2 IMPLANT
NS IRRIG 500ML POUR BTL (IV SOLUTION) ×1 IMPLANT
PACK EXTREMITY ARMC (MISCELLANEOUS) ×1 IMPLANT
PACKING GAUZE IODOFORM 1INX5YD (GAUZE/BANDAGES/DRESSINGS) IMPLANT
PAD ABD DERMACEA PRESS 5X9 (GAUZE/BANDAGES/DRESSINGS) IMPLANT
PULSAVAC PLUS IRRIG FAN TIP (DISPOSABLE)
RASP SM TEAR CROSS CUT (RASP) IMPLANT
SOL PREP PVP 2OZ (MISCELLANEOUS)
SOLUTION PREP PVP 2OZ (MISCELLANEOUS) IMPLANT
STOCKINETTE STRL 6IN 960660 (GAUZE/BANDAGES/DRESSINGS) ×1 IMPLANT
SUT ETHILON 3-0 FS-10 30 BLK (SUTURE)
SUT ETHILON 4-0 (SUTURE)
SUT ETHILON 4-0 FS2 18XMFL BLK (SUTURE)
SUT VIC AB 3-0 SH 27 (SUTURE)
SUT VIC AB 3-0 SH 27X BRD (SUTURE) IMPLANT
SUT VIC AB 4-0 FS2 27 (SUTURE) IMPLANT
SUTURE EHLN 3-0 FS-10 30 BLK (SUTURE) IMPLANT
SUTURE ETHLN 4-0 FS2 18XMF BLK (SUTURE) IMPLANT
SWAB CULTURE AMIES ANAERIB BLU (MISCELLANEOUS) IMPLANT
SYR 10ML LL (SYRINGE) ×1 IMPLANT
TIP FAN IRRIG PULSAVAC PLUS (DISPOSABLE) IMPLANT
TRAP FLUID SMOKE EVACUATOR (MISCELLANEOUS) ×1 IMPLANT
WATER STERILE IRR 500ML POUR (IV SOLUTION) ×1 IMPLANT
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
## 2022-05-29 NOTE — Plan of Care (Signed)

## 2022-05-29 NOTE — Progress Notes (Signed)
[place] at [place] NAME: Erik Barker    MR#:  [id number]  DATE OF BIRTH:  Aug 23, 1970  SUBJECTIVE:  Foot surgery cancelled due to UDS positive for cocaine   VITALS:  Blood pressure 121/85, pulse 70, temperature 98.2 F (36.8 C), resp. rate 18, height 5' 11"$  (1.803 m), weight 74.8 kg, SpO2 100 %.  PHYSICAL EXAMINATION:   GENERAL:  52 y.o.-year-old patient with no acute distress.  LUNGS: Normal breath sounds bilaterally, no wheezing CARDIOVASCULAR: S1, S2 normal. No murmur   ABDOMEN: Soft, nontender, nondistended. Bowel sounds present.  EXTREMITIES:  NEUROLOGIC: nonfocal  patient is alert and awake SKIN: as above  LABORATORY PANEL:  CBC Recent Labs  Lab 05/28/22 0915  WBC 6.8  HGB 13.0  HCT 39.9  PLT 288     Chemistries  Recent Labs  Lab 05/28/22 0915 05/29/22 0420  NA 136  --   K 3.8  --   CL 106  --   CO2 23  --   GLUCOSE 159*  --   BUN 11  --   CREATININE 0.76 0.86  CALCIUM 8.3*  --   AST 41  --   ALT 40  --   ALKPHOS 78  --   BILITOT 0.6  --     Cardiac Enzymes No results for input(s): "TROPONINI" in the last 168 hours. RADIOLOGY:  CT FOOT RIGHT W CONTRAST  Result Date: 05/27/2022 CLINICAL DATA:  Soft tissue infection suspected, foot, no prior imaging EXAM: CT OF THE LOWER RIGHT EXTREMITY WITH CONTRAST TECHNIQUE: Multidetector CT imaging of the lower right extremity was performed according to the standard protocol following intravenous contrast administration. RADIATION DOSE REDUCTION: This exam was performed according to the departmental dose-optimization program which includes automated exposure control, adjustment of the mA and/or kV according to patient size and/or use of iterative reconstruction technique. CONTRAST:  174m OMNIPAQUE IOHEXOL 300 MG/ML  SOLN COMPARISON:  CT 03/30/2022 FINDINGS: Bones/Joint/Cartilage There are erosive changes at the first tarsometatarsal joint with developing bony  fragmentation, progressed from prior exam, with increased sclerosis periosteal reaction along the proximal first metatarsal shaft. Unchanged tibialis anterior avulsion fracture. 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 Ligaments Suboptimally assessed by CT. Muscles and Tendons Unchanged tibialis anterior avulsion avulsion fracture with distal tendon thickening and hypodensity. Soft tissues There is a rim enhancing collection adjacent to the medial aspect of the first TMT joint (series 100 images 178-195). This area was previously ill-defined and not fluid density, this is likely an abscess and measures 1.6 x 0.6 x 1.9 cm (series 10, image 144, series 100, image 184). IMPRESSION: Evolving septic arthritis and periarticular osteomyelitis of the first TMT joint with bony erosion and new fluid collection suspicious for abscess along the medial aspect of the joint measuring 1.6 x 0.6 x 1.9 cm. Electronically Signed   By: Erik SimmeringM.D.   On: 05/27/2022 15:34    Assessment and Plan  Erik AUTHIERis a 52y.o. male with medical history significant of polysubstance abuse, hepatitis C, recent osteomyelitis complicated by MSSA bacteremia with incomplete treatment (December 2023) who presents to the ED due to foot pain.  Pt states that his foot began to swell and turn red approximately 10 days ago.  Since then, it has been gradually worsening.  He has noticed occasional drainage from the bottom from a wound that initially occurred in November 2023.   In addition, he had not followed up with any  physician after his most recent hospitalization in December 2023 and he tried to remove his stitches himself but he could not get them all out.   CT of the foot was obtained that demonstrated evolving septic arthritis and periarticular osteomyelitis of the first TMT joint, in addition to a new developing abscess.   Septic arthritis [facility]) Patient presenting with 10-day history of worsening right foot erythema,  edema and pain with CT imaging demonstrating septic arthritis of the first TMT joint.  Patient is afebrile at this time and hemodynamically stable with no evidence of sepsis.  Given recent IV drug use, will cover for MRSA although prior cultures only grew MSSA.  - Podiatry consulted--plans for I and D tomorrow if UDS neg - N.p.o. after midnight - Continue vancomycin and cefepime - Norco and Dilaudid for pain control - Blood cultures negative --consult ID on Monday for help with abxs   Osteomyelitis of ankle or foot, acute, right ([place]) CT obtained of the foot earlier also demonstrates osteomyelitis surrounding area of septic arthritis. - Podiatry consulted; appreciate their recommendations - Please see above for management of both osteo and septic arthritis   Hepatitis C On last admission, hepatitis C viral load was elevated.  Patient is interested in treatment and is willing to follow-up with ID.   --Please refer to ID prior to discharge   Polysubstance abuse [facility]) Patient states that he uses a variety of substances, generally when he is with his friends.  Most recent intravenous use was approximately 2 weeks ago. --2/18--UDS positive for cocaine. Repeat in am  Procedures: Family communication :none Consults :Podiatry CODE STATUS: full DVT Prophylaxis :lovenox Level of care: Med-Surg Status is: Inpatient Remains inpatient appropriate because: right foot abscess and OM    TOTAL TIME TAKING CARE OF THIS PATIENT: 35 minutes.  >50% time spent on counselling and coordination of care  Note: This dictation was prepared with Dragon dictation along with smaller phrase technology. Any transcriptional errors that result from this process are unintentional.  Erik Barker M.D    [facility]   CC: Primary care physician; Pcp, No
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
## 2022-05-30 DIAGNOSIS — M86171 Other acute osteomyelitis, right ankle and foot: Secondary | ICD-10-CM

## 2022-05-30 DIAGNOSIS — L02611 Cutaneous abscess of right foot: Secondary | ICD-10-CM

## 2022-05-30 DIAGNOSIS — M00871 Arthritis due to other bacteria, right ankle and foot: Secondary | ICD-10-CM

## 2022-05-30 DIAGNOSIS — M00071 Staphylococcal arthritis, right ankle and foot: Secondary | ICD-10-CM

## 2022-05-30 LAB — CREATININE, SERUM
Creatinine, Ser: 0.73 mg/dL (ref 0.61–1.24)
GFR, Estimated: >60 mL/min (ref >60)

## 2022-05-30 LAB — URINE DRUG SCREEN, QUALITATIVE (ARMC ONLY)
Amphetamines, Ur Screen: NOT DETECTED
Barbiturates, Ur Screen: NOT DETECTED
Benzodiazepine, Ur Scrn: NOT DETECTED
Cannabinoid 50 Ng, Ur ~~LOC~~: NOT DETECTED
Cocaine Metabolite,Ur ~~LOC~~: POSITIVE — AB
MDMA (Ecstasy)Ur Screen: NOT DETECTED
Methadone Scn, Ur: NOT DETECTED
Opiate, Ur Screen: POSITIVE — AB
Phencyclidine (PCP) Ur S: NOT DETECTED
Tricyclic, Ur Screen: NOT DETECTED

## 2022-05-30 MED ORDER — POLYETHYLENE GLYCOL 3350 17 G PO PACK
17.0000 g | PACK | Freq: Every day | ORAL | Status: DC
  Administered 2022-05-30 – 2022-06-02 (×3): 17 g via ORAL
  Filled 2022-05-30 (×3): qty 1

## 2022-05-30 NOTE — Progress Notes (Signed)
Progress Note   Patient: Erik Barker [id number] DOB: [birth year] DOA: 05/27/2022     3 DOS: the patient was seen and examined on 05/30/2022   Brief hospital course: 52 y.o. male with medical history significant of polysubstance abuse, hepatitis C, recent osteomyelitis complicated by MSSA bacteremia  (December 2023).  Patient signed out [place] back in December and oral antibiotics long course was prescribed.  He states he took these medications and his foot got better.  He did not follow-up with any physicians.  He presents to the ED due to foot pain.   Mr. Blancett states that his foot began to swell and turn red approximately 10 days ago. Since then, it has been gradually worsening.  He has noticed occasional drainage from the bottom from a wound that initially occurred in November 2023.  He is uncertain if the drainage had an odor to it.  He notes that he frequently walks barefoot and wonders if this may have led to an infection coming back.  In addition, he had not followed up with any physician after his most recent hospitalization in December 2023 and he tried to remove his stitches himself but he could not get them all out.  He notes that the pain in his right foot has started to move upwards to above his ankle but he has not noticed any skin changes in the affected area.  He denies any other symptoms at this time including fever, chills, nausea, vomiting, abdominal pain, chest pain, shortness of breath.     2/19.  Surgery canceled today secondary to cocaine still on the urine toxicology.  Assessment and Plan: * Septic arthritis ([place]) Abscess and osteomyelitis.  Will get infectious disease consultation.  Podiatry planning on doing surgery once cocaine out of his system.  Blood cultures from 2/16 are negative.  Osteomyelitis of ankle or foot, acute, right ([facility]) Osteomyelitis right foot.  Also seen on prior CT scans.  Hepatitis C On last admission, hepatitis C viral  load was elevated.  Patient is interested in treatment and is willing to follow-up with ID.    Polysubstance abuse [facility]) Patient states that he uses a variety of substances, generally when he is with his friends.  Most recent intravenous use was approximately 2 weeks ago. Urine toxicology still positive for cocaine.  Surgery canceled for today.        Subjective: Patient signed out [place] back in December.  He stated he took all the antibiotics and his foot was better.  He did not follow-up.  Surgery on hold again today with cocaine still in his system.  Physical Exam: Vitals:   05/29/22 0855 05/29/22 1559 05/29/22 2336 05/30/22 0731  BP: 121/85 111/60 (!) 100/53 109/68  Pulse: 70 74 60 60  Resp: 18 17 18 17  $ Temp: 98.2 F (36.8 C) (!) 97.4 F (36.3 C) 98.4 F (36.9 C) 98.3 F (36.8 C)  TempSrc:      SpO2: 100% 100% 99% 100%  Weight:      Height:       Physical Exam HENT:     Head: Normocephalic.  Eyes:     General: Lids are normal.     Conjunctiva/sclera: Conjunctivae normal.  Cardiovascular:     Rate and Rhythm: Normal rate and regular rhythm.     Heart sounds: Normal heart sounds, S1 normal and S2 normal.  Pulmonary:     Breath sounds: No decreased breath sounds, wheezing, rhonchi or rales.  Abdominal:  Palpations: Abdomen is soft.     Tenderness: There is no abdominal tenderness.  Musculoskeletal:     Comments: Slight erythema foot.  Looks like there is 1 stitch in his foot.  Skin:    General: Skin is warm.     Findings: No rash.     Comments: Swelling right foot.  Neurological:     Mental Status: He is alert and oriented to person, place, and time.     Data Reviewed: Creatinine 0.73 Cocaine positive on urine toxicology  Disposition: Status is: Inpatient Remains inpatient appropriate because: Awaiting surgery but with cocaine in the urine toxicology again today will have to hold off on surgery today.  Planned Discharge Destination:  Home    Time spent: 28 minutes  Author: Loletha Grayer, MD 05/30/2022 1:33 PM  For on call review [email or web address].

## 2022-05-30 NOTE — Hospital Course (Addendum)
52 y.o. male with medical history significant of polysubstance abuse, hepatitis C, recent osteomyelitis complicated by MSSA bacteremia  (December 2023).  Patient signed out [place] back in December and oral antibiotics long course was prescribed.  He states he took these medications and his foot got better.  He did not follow-up with any physicians.  He presents to the ED due to foot pain.   Erik Barker states that his foot began to swell and turn red approximately 10 days ago. Since then, it has been gradually worsening.  He has noticed occasional drainage from the bottom from a wound that initially occurred in November 2023.  He is uncertain if the drainage had an odor to it.  He notes that he frequently walks barefoot and wonders if this may have led to an infection coming back.  In addition, he had not followed up with any physician after his most recent hospitalization in December 2023 and he tried to remove his stitches himself but he could not get them all out.  He notes that the pain in his right foot has started to move upwards to above his ankle but he has not noticed any skin changes in the affected area.  He denies any other symptoms at this time including fever, chills, nausea, vomiting, abdominal pain, chest pain, shortness of breath.     2/19.  Surgery canceled today secondary to cocaine still on the urine toxicology. 2/20.  Urine toxicology negative for cocaine.  Stable for surgery. 2/21: S/p incision and drainage along with bone biopsy by podiatry yesterday.  Labs with mild  preliminary bone cultures with rare Staph aureus.  Blood cultures during current hospitalization remain negative in 4 days.  Patient was threatening to leave AMA. 2/22: Vital stable.  Refusing most of the care including labs.  Susceptibility is not back yet. Patient becoming more irritable and eventually left AMA. Prescriptions for Keflex 1 g every 6 hourly and ciprofloxacin 500 mg twice daily for 4  weeks were sent to his pharmacy.  Ideally he should also get dalbavancin weekly as recommended by ID. Not sure whether he will do it based on his behavior.  Eventually agrees to get 1 dose of dalbavancin by ID.  They were advising weekly dalbavancin along with Keflex and ciprofloxacin based on prior culture sensitivities as new sensitivities are still not available.  Hopefully patient can follow-up.  Patient is very high risk for deterioration, readmission and losing a limb based on his behavior and not letting providers take care of it appropriately.

## 2022-05-30 NOTE — Plan of Care (Signed)

## 2022-05-30 NOTE — Plan of Care (Signed)
  Problem: Education: Goal: Knowledge of General Education information will improve Description: Including pain rating scale, medication(s)/side effects and non-pharmacologic comfort measures Outcome: Progressing   Problem: Activity: Goal: Risk for activity intolerance will decrease Outcome: Progressing   Problem: Nutrition: Goal: Adequate nutrition will be maintained Outcome: Progressing   Problem: Coping: Goal: Level of anxiety will decrease Outcome: Progressing   Problem: Elimination: Goal: Will not experience complications related to bowel motility Outcome: Progressing   Problem: Pain Managment: Goal: General experience of comfort will improve Outcome: Progressing   Problem: Skin Integrity: Goal: Risk for impaired skin integrity will decrease Outcome: Progressing

## 2022-05-30 NOTE — Treatment Plan (Signed)
Urine drug screen still positive, will cancel OR for today and try tomorrow. Diet ordered. NPO past MN tonight. Continue abx  Lanae Crumbly, DPM 05/30/2022
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
## 2022-05-30 NOTE — Consult Note (Signed)
Pharmacy Antibiotic Note  Erik Barker is a 52 y.o. male admitted on 05/27/2022 with R foot pain x several days. Patient with PMH of polysubstance abuse including IVDA. Patient recently admitted 12/20-12/25/23 for osteomyelitis of R ankle and MSSA bacteremia 02/2022; however patient left AMA prior to TEE and was discharged on PO keflex 1 gram QID x 4 weeks.  Pharmacy has been consulted for cefepime and vancomycin dosing.  Plan:  Day 4   1) continue cefepime 2 grams IV Q8H  2) adjust vancomycin to 1000 mg Q12H. Goal AUC 400-600 Estimated AUC 460.1/Cmin: 12.3 Scr 0.80 mg/dL (rounded up), Vd 0.72  Ke 0.081 h-1, T1/2 8h  Height: 5' 11"$  (180.3 cm) Weight: 74.8 kg (165 lb) IBW/kg (Calculated) : 75.3  Temp (24hrs), Avg:98 F (36.7 C), Min:97.4 F (36.3 C), Max:98.4 F (36.9 C)  Recent Labs  Lab 05/27/22 1300 05/27/22 1553 05/28/22 0915 05/29/22 0420 05/30/22 0642  WBC 8.0  --  6.8  --   --   CREATININE 0.87  --  0.76 0.86 0.73  LATICACIDVEN 2.1* 1.3  --   --   --      Estimated Creatinine Clearance: 114.3 mL/min (by C-G formula based on SCr of 0.73 mg/dL).    No Known Allergies  Antimicrobials this admission: 2/16 vancomycin >>  2/16 cefepime >>  2/16 metronidazole > Discontinued   Microbiology results: 2/16 BCx: NG x 3 days    Thank you for allowing pharmacy to be a part of this patient's care.  Mirna Mires, Pharmacy Student  05/30/2022 10:52 AM

## 2022-05-31 ENCOUNTER — Inpatient Hospital Stay: Payer: Medicare HMO | Admitting: Anesthesiology

## 2022-05-31 ENCOUNTER — Encounter
Admission: EM | Discharge status: Against Medical Advice | Payer: Self-pay | Source: Home / Self Care | Attending: Internal Medicine

## 2022-05-31 ENCOUNTER — Encounter: Payer: Self-pay | Admitting: Internal Medicine

## 2022-05-31 ENCOUNTER — Other Ambulatory Visit: Payer: Self-pay

## 2022-05-31 DIAGNOSIS — M86671 Other chronic osteomyelitis, right ankle and foot: Secondary | ICD-10-CM

## 2022-05-31 HISTORY — PX: INCISION AND DRAINAGE: SHX5863

## 2022-05-31 HISTORY — PX: BONE BIOPSY: SHX375

## 2022-05-31 LAB — URINE DRUG SCREEN, QUALITATIVE (ARMC ONLY)
Amphetamines, Ur Screen: NOT DETECTED
Barbiturates, Ur Screen: NOT DETECTED
Benzodiazepine, Ur Scrn: NOT DETECTED
Cannabinoid 50 Ng, Ur ~~LOC~~: NOT DETECTED
Cocaine Metabolite,Ur ~~LOC~~: NOT DETECTED
MDMA (Ecstasy)Ur Screen: NOT DETECTED
Methadone Scn, Ur: NOT DETECTED
Opiate, Ur Screen: POSITIVE — AB
Phencyclidine (PCP) Ur S: NOT DETECTED
Tricyclic, Ur Screen: NOT DETECTED

## 2022-05-31 SURGERY — INCISION AND DRAINAGE
Anesthesia: General | Laterality: Right

## 2022-05-31 SURGERY — INCISION AND DRAINAGE
Anesthesia: Choice | Laterality: Right

## 2022-05-31 MED ORDER — FENTANYL CITRATE (PF) 100 MCG/2ML IJ SOLN
INTRAMUSCULAR | Status: DC | PRN
  Administered 2022-05-31: 50 ug via INTRAVENOUS

## 2022-05-31 MED ORDER — KETAMINE HCL 50 MG/5ML IJ SOSY
PREFILLED_SYRINGE | INTRAMUSCULAR | Status: AC
  Filled 2022-05-31: qty 5

## 2022-05-31 MED ORDER — LIDOCAINE HCL (PF) 1 % IJ SOLN
INTRAMUSCULAR | Status: DC | PRN
  Administered 2022-05-31: 20 mL via INTRAMUSCULAR

## 2022-05-31 MED ORDER — OXYCODONE HCL 5 MG/5ML PO SOLN: 5.0000 mg | Freq: Once | ORAL | Status: DC | PRN

## 2022-05-31 MED ORDER — ONDANSETRON HCL 4 MG/2ML IJ SOLN
INTRAMUSCULAR | Status: AC
  Filled 2022-05-31: qty 2

## 2022-05-31 MED ORDER — MIDAZOLAM HCL 2 MG/2ML IJ SOLN
INTRAMUSCULAR | Status: DC | PRN
  Administered 2022-05-31: 2 mg via INTRAVENOUS

## 2022-05-31 MED ORDER — LACTATED RINGERS IV SOLN: INTRAVENOUS | Status: DC

## 2022-05-31 MED ORDER — BUPIVACAINE HCL (PF) 0.5 % IJ SOLN
INTRAMUSCULAR | Status: AC
  Filled 2022-05-31: qty 30

## 2022-05-31 MED ORDER — MIDAZOLAM HCL 2 MG/2ML IJ SOLN
INTRAMUSCULAR | Status: AC
  Filled 2022-05-31: qty 2

## 2022-05-31 MED ORDER — ACETAMINOPHEN 10 MG/ML IV SOLN: 1000.0000 mg | Freq: Once | INTRAVENOUS | Status: DC | PRN

## 2022-05-31 MED ORDER — OXYCODONE HCL 5 MG PO TABS: 5.0000 mg | ORAL_TABLET | Freq: Once | ORAL | Status: DC | PRN

## 2022-05-31 MED ORDER — CHLORHEXIDINE GLUCONATE CLOTH 2 % EX PADS: 6.0000 | MEDICATED_PAD | Freq: Once | CUTANEOUS | Status: DC

## 2022-05-31 MED ORDER — ONDANSETRON HCL 4 MG/2ML IJ SOLN: 4.0000 mg | Freq: Once | INTRAMUSCULAR | Status: DC | PRN

## 2022-05-31 MED ORDER — PROPOFOL 10 MG/ML IV BOLUS
INTRAVENOUS | Status: DC | PRN
  Administered 2022-05-31 (×2): 50 mg via INTRAVENOUS

## 2022-05-31 MED ORDER — LIDOCAINE HCL (PF) 2 % IJ SOLN
INTRAMUSCULAR | Status: AC
  Filled 2022-05-31: qty 5

## 2022-05-31 MED ORDER — FENTANYL CITRATE (PF) 100 MCG/2ML IJ SOLN: 25.0000 ug | INTRAMUSCULAR | Status: DC | PRN

## 2022-05-31 MED ORDER — PROPOFOL 1000 MG/100ML IV EMUL
INTRAVENOUS | Status: AC
  Filled 2022-05-31: qty 100

## 2022-05-31 MED ORDER — FENTANYL CITRATE (PF) 100 MCG/2ML IJ SOLN
INTRAMUSCULAR | Status: AC
  Filled 2022-05-31: qty 2

## 2022-05-31 MED ORDER — CHLORHEXIDINE GLUCONATE 0.12 % MT SOLN
OROMUCOSAL | Status: AC
  Administered 2022-05-31: 15 mL
  Filled 2022-05-31: qty 15

## 2022-05-31 MED ORDER — DEXAMETHASONE SODIUM PHOSPHATE 10 MG/ML IJ SOLN
INTRAMUSCULAR | Status: AC
  Filled 2022-05-31: qty 1

## 2022-05-31 MED ORDER — KETAMINE HCL 10 MG/ML IJ SOLN
INTRAMUSCULAR | Status: DC | PRN
  Administered 2022-05-31: 30 mg via INTRAVENOUS

## 2022-05-31 MED ORDER — 0.9 % SODIUM CHLORIDE (POUR BTL) OPTIME
TOPICAL | Status: DC | PRN
  Administered 2022-05-31: 1000 mL

## 2022-05-31 MED ORDER — LACTATED RINGERS IV SOLN: INTRAVENOUS | Status: DC | PRN

## 2022-05-31 MED ORDER — LIDOCAINE HCL (PF) 1 % IJ SOLN
INTRAMUSCULAR | Status: AC
  Filled 2022-05-31: qty 30

## 2022-05-31 MED ORDER — PROPOFOL 500 MG/50ML IV EMUL
INTRAVENOUS | Status: DC | PRN
  Administered 2022-05-31: 100 ug/kg/min via INTRAVENOUS

## 2022-05-31 SURGICAL SUPPLY — 81 items
BLADE OSC/SAGITTAL MD 5.5X18 (BLADE) IMPLANT
BLADE OSCILLATING/SAGITTAL (BLADE)
BLADE SURG 15 STRL LF DISP TIS (BLADE) ×1 IMPLANT
BLADE SURG 15 STRL SS (BLADE) ×1
BLADE SW THK.38XMED LNG THN (BLADE) IMPLANT
BNDG COHESIVE 4X5 TAN NS LF (GAUZE/BANDAGES/DRESSINGS) ×1 IMPLANT
BNDG COHESIVE 4X5 TAN STRL LF (GAUZE/BANDAGES/DRESSINGS) ×1 IMPLANT
BNDG COHESIVE 6X5 TAN ST LF (GAUZE/BANDAGES/DRESSINGS) ×1 IMPLANT
BNDG ELASTIC 4X5.8 VLCR STR LF (GAUZE/BANDAGES/DRESSINGS) ×1 IMPLANT
BNDG ESMARCH 4 X 12 STRL LF (GAUZE/BANDAGES/DRESSINGS) ×1
BNDG ESMARCH 4X12 STRL LF (GAUZE/BANDAGES/DRESSINGS) ×1 IMPLANT
BNDG GAUZE DERMACEA FLUFF 4 (GAUZE/BANDAGES/DRESSINGS) ×1 IMPLANT
BNDG STRETCH GAUZE 3IN X12FT (GAUZE/BANDAGES/DRESSINGS) ×1 IMPLANT
CANISTER WOUND CARE 500ML ATS (WOUND CARE) ×1 IMPLANT
CHLORAPREP W/TINT 26 (MISCELLANEOUS) ×1 IMPLANT
CNTNR URN SCR LID CUP LEK RST (MISCELLANEOUS) IMPLANT
CONT SPEC 4OZ STRL OR WHT (MISCELLANEOUS)
COVER MAYO STAND STRL (DRAPES) ×1 IMPLANT
CUFF TOURN SGL QUICK 12 (TOURNIQUET CUFF) IMPLANT
CUFF TOURN SGL QUICK 18X4 (TOURNIQUET CUFF) IMPLANT
DRAPE FLUOR MINI C-ARM 54X84 (DRAPES) IMPLANT
DRAPE XRAY CASSETTE 23X24 (DRAPES) IMPLANT
DURAPREP 26ML APPLICATOR (WOUND CARE) ×1 IMPLANT
ELECT REM PT RETURN 9FT ADLT (ELECTROSURGICAL) ×1
ELECTRODE REM PT RTRN 9FT ADLT (ELECTROSURGICAL) ×1 IMPLANT
GAUZE 4X4 16PLY ~~LOC~~+RFID DBL (SPONGE) ×1 IMPLANT
GAUZE PACKING 0.25INX5YD STRL (GAUZE/BANDAGES/DRESSINGS) ×1 IMPLANT
GAUZE SPONGE 4X4 12PLY STRL (GAUZE/BANDAGES/DRESSINGS) ×1 IMPLANT
GAUZE STRETCH 2X75IN STRL (MISCELLANEOUS) ×1 IMPLANT
GAUZE XEROFORM 1X8 LF (GAUZE/BANDAGES/DRESSINGS) ×1 IMPLANT
GLOVE BIO SURGEON STRL SZ7.5 (GLOVE) ×1 IMPLANT
GLOVE PI ORTHO PRO STRL SZ7 (GLOVE) ×1 IMPLANT
GLOVE SURG ORTHO LTX SZ7.5 (GLOVE) ×1 IMPLANT
GLOVE SURG UNDER POLY LF SZ7.5 (GLOVE) ×1 IMPLANT
GOWN STRL REUS W/ TWL LRG LVL3 (GOWN DISPOSABLE) ×1 IMPLANT
GOWN STRL REUS W/TWL LRG LVL3 (GOWN DISPOSABLE) ×1
GOWN STRL REUS W/TWL MED LVL3 (GOWN DISPOSABLE) ×1 IMPLANT
HANDPIECE VERSAJET DEBRIDEMENT (MISCELLANEOUS) IMPLANT
IV NS 1000ML (IV SOLUTION) ×1
IV NS 1000ML BAXH (IV SOLUTION) ×1 IMPLANT
KIT BASIN OR (CUSTOM PROCEDURE TRAY) ×1 IMPLANT
KIT DRSG VAC SLVR GRANUFM (MISCELLANEOUS) ×1 IMPLANT
KIT TURNOVER CYSTO (KITS) ×1 IMPLANT
KIT TURNOVER KIT A (KITS) ×1 IMPLANT
LABEL OR SOLS (LABEL) ×1 IMPLANT
MANIFOLD NEPTUNE II (INSTRUMENTS) ×1 IMPLANT
NEEDLE BIOPSY JAMSHIDI 11X6 (NEEDLE) ×1 IMPLANT
NEEDLE FILTER BLUNT 18X1 1/2 (NEEDLE) ×1 IMPLANT
NEEDLE HYPO 22GX1.5 SAFETY (NEEDLE) IMPLANT
NEEDLE HYPO 25X1 1.5 SAFETY (NEEDLE) ×2 IMPLANT
NS IRRIG 500ML POUR BTL (IV SOLUTION) ×1 IMPLANT
PACK EXTREMITY (MISCELLANEOUS) ×1 IMPLANT
PACK EXTREMITY ARMC (MISCELLANEOUS) ×1 IMPLANT
PACKING GAUZE IODOFORM 1INX5YD (GAUZE/BANDAGES/DRESSINGS) ×1 IMPLANT
PAD ABD DERMACEA PRESS 5X9 (GAUZE/BANDAGES/DRESSINGS) ×1 IMPLANT
PENCIL SMOKE EVACUATOR (MISCELLANEOUS) IMPLANT
PULSAVAC PLUS IRRIG FAN TIP (DISPOSABLE) ×1
RASP SM TEAR CROSS CUT (RASP) IMPLANT
SHIELD FULL FACE ANTIFOG 7M (MISCELLANEOUS) ×1 IMPLANT
SOL PREP PVP 2OZ (MISCELLANEOUS) ×1
SOLUTION PREP PVP 2OZ (MISCELLANEOUS) ×1 IMPLANT
STOCKINETTE 4X48 STRL (DRAPES) IMPLANT
STOCKINETTE IMPERVIOUS 9X36 MD (GAUZE/BANDAGES/DRESSINGS) ×1 IMPLANT
SUT ETHILON 2 0 FS 18 (SUTURE) ×2 IMPLANT
SUT ETHILON 4 0 PS 2 18 (SUTURE) IMPLANT
SUT ETHILON 4-0 (SUTURE) ×1
SUT ETHILON 4-0 FS2 18XMFL BLK (SUTURE) ×1
SUT VIC AB 3-0 SH 27 (SUTURE) ×1
SUT VIC AB 3-0 SH 27X BRD (SUTURE) ×1 IMPLANT
SUT VIC AB 4-0 FS2 27 (SUTURE) ×1 IMPLANT
SUTURE ETHLN 4-0 FS2 18XMF BLK (SUTURE) ×1 IMPLANT
SWAB CULTURE AMIES ANAERIB BLU (MISCELLANEOUS) IMPLANT
SWAB CULTURE ESWAB REG 1ML (MISCELLANEOUS) IMPLANT
SYR 10ML LL (SYRINGE) ×1 IMPLANT
SYR 3ML LL SCALE MARK (SYRINGE) ×1 IMPLANT
SYR CONTROL 10ML LL (SYRINGE) ×2 IMPLANT
TIP FAN IRRIG PULSAVAC PLUS (DISPOSABLE) ×1 IMPLANT
TRAP FLUID SMOKE EVACUATOR (MISCELLANEOUS) ×1 IMPLANT
TUBING CONNECTOR 18X5MM (MISCELLANEOUS) IMPLANT
UNDERPAD 30X36 HEAVY ABSORB (UNDERPADS AND DIAPERS) ×1 IMPLANT
WATER STERILE IRR 500ML POUR (IV SOLUTION) ×1 IMPLANT

## 2022-05-31 SURGICAL SUPPLY — 55 items
BNDG COHESIVE 4X5 TAN STRL LF (GAUZE/BANDAGES/DRESSINGS) ×1 IMPLANT
BNDG COHESIVE 6X5 TAN ST LF (GAUZE/BANDAGES/DRESSINGS) ×1 IMPLANT
BNDG ELASTIC 4X5.8 VLCR STR LF (GAUZE/BANDAGES/DRESSINGS) ×1 IMPLANT
BNDG ESMARCH 4 X 12 STRL LF (GAUZE/BANDAGES/DRESSINGS) ×1
BNDG ESMARCH 4X12 STRL LF (GAUZE/BANDAGES/DRESSINGS) ×1 IMPLANT
BNDG GAUZE DERMACEA FLUFF 4 (GAUZE/BANDAGES/DRESSINGS) ×1 IMPLANT
BNDG STRETCH GAUZE 3IN X12FT (GAUZE/BANDAGES/DRESSINGS) ×1 IMPLANT
CUFF TOURN SGL QUICK 18X4 (TOURNIQUET CUFF) IMPLANT
CUFF TOURN SGL QUICK 24 (TOURNIQUET CUFF)
CUFF TRNQT CYL 24X4X16.5-23 (TOURNIQUET CUFF) IMPLANT
DRSG EMULSION OIL 3X8 NADH (GAUZE/BANDAGES/DRESSINGS) ×1 IMPLANT
DURAPREP 26ML APPLICATOR (WOUND CARE) ×1 IMPLANT
ELECT REM PT RETURN 9FT ADLT (ELECTROSURGICAL) ×1
ELECTRODE REM PT RTRN 9FT ADLT (ELECTROSURGICAL) ×1 IMPLANT
GAUZE PACKING 0.25INX5YD STRL (GAUZE/BANDAGES/DRESSINGS) IMPLANT
GAUZE SPONGE 4X4 12PLY STRL (GAUZE/BANDAGES/DRESSINGS) ×1 IMPLANT
GAUZE STRETCH 2X75IN STRL (MISCELLANEOUS) ×1 IMPLANT
GLOVE BIOGEL PI IND STRL 8 (GLOVE) ×1 IMPLANT
GLOVE SURG LX STRL 8.0 MICRO (GLOVE) ×1 IMPLANT
GOWN STRL REUS W/ TWL XL LVL3 (GOWN DISPOSABLE) ×1 IMPLANT
GOWN STRL REUS W/TWL MED LVL3 (GOWN DISPOSABLE) ×1 IMPLANT
GOWN STRL REUS W/TWL XL LVL3 (GOWN DISPOSABLE) ×1
HANDPIECE VERSAJET DEBRIDEMENT (MISCELLANEOUS) IMPLANT
IV NS 1000ML (IV SOLUTION)
IV NS 1000ML BAXH (IV SOLUTION) ×1 IMPLANT
IV NS IRRIG 3000ML ARTHROMATIC (IV SOLUTION) IMPLANT
KIT TURNOVER KIT A (KITS) ×1 IMPLANT
LABEL OR SOLS (LABEL) ×1 IMPLANT
MANIFOLD NEPTUNE II (INSTRUMENTS) ×1 IMPLANT
NDL FILTER BLUNT 18X1 1/2 (NEEDLE) ×1 IMPLANT
NDL HYPO 25X1 1.5 SAFETY (NEEDLE) ×1 IMPLANT
NEEDLE FILTER BLUNT 18X1 1/2 (NEEDLE) ×1 IMPLANT
NEEDLE HYPO 25X1 1.5 SAFETY (NEEDLE) ×1 IMPLANT
NS IRRIG 500ML POUR BTL (IV SOLUTION) ×1 IMPLANT
PACK EXTREMITY ARMC (MISCELLANEOUS) ×1 IMPLANT
PACKING GAUZE IODOFORM 1INX5YD (GAUZE/BANDAGES/DRESSINGS) IMPLANT
PAD ABD DERMACEA PRESS 5X9 (GAUZE/BANDAGES/DRESSINGS) ×1 IMPLANT
PULSAVAC PLUS IRRIG FAN TIP (DISPOSABLE)
SOL PREP PVP 2OZ (MISCELLANEOUS)
SOLUTION PREP PVP 2OZ (MISCELLANEOUS) ×1 IMPLANT
STAPLER SKIN PROX 35W (STAPLE) IMPLANT
STOCKINETTE IMPERVIOUS 9X36 MD (GAUZE/BANDAGES/DRESSINGS) ×1 IMPLANT
SUT ETHILON 2 0 FS 18 (SUTURE) IMPLANT
SUT ETHILON 4-0 (SUTURE)
SUT ETHILON 4-0 FS2 18XMFL BLK (SUTURE)
SUT PROLENE 3 0 PS 2 (SUTURE) IMPLANT
SUT VIC AB 3-0 SH 27 (SUTURE)
SUT VIC AB 3-0 SH 27X BRD (SUTURE) IMPLANT
SUT VIC AB 4-0 FS2 27 (SUTURE) IMPLANT
SUTURE ETHLN 4-0 FS2 18XMF BLK (SUTURE) IMPLANT
SWAB CULTURE AMIES ANAERIB BLU (MISCELLANEOUS) IMPLANT
SYR 10ML LL (SYRINGE) ×2 IMPLANT
TIP FAN IRRIG PULSAVAC PLUS (DISPOSABLE) IMPLANT
TRAP FLUID SMOKE EVACUATOR (MISCELLANEOUS) ×1 IMPLANT
WATER STERILE IRR 500ML POUR (IV SOLUTION) ×1 IMPLANT

## 2022-05-31 NOTE — Progress Notes (Incomplete)
           Against Medical Advice  NAME: Erik Barker MRN: [id number] DOB : 1970-08-04 ATTENDING PHYSICIAN: Loletha Grayer, MD                                               Patient at this time expresses desire to leave the Hospital immediately, patient has been warned that this is not Medically advisable at this time, and can result in Medical complications like Death and Disability. Specifically discussed *** Patient has full decision making capacity and understands and accepts the risks involved and assumes full responsibilty of this decision.  This patient has also been advised that if they feel the need for further medical assistance to return to the closest ER or dial [phone].  Informed by Nursing staff that this patient has left care and has signed the [facility] form.     This document was prepared using Dragon voice recognition software and may include unintentional dictation errors.  Neomia Glass DNP, MBA, FNP-BC, PMHNP-BC Nurse Practitioner [facility] Pager [phone]

## 2022-05-31 NOTE — TOC Progression Note (Signed)
Transition of Care [facility]) - Progression Note    Patient Details  Name: Erik Barker MRN: [id number] Date of Birth: 24-Oct-1970  Transition of Care [facility]) CM/SW [place], RN Phone Number: 05/31/2022, 9:57 AM  Clinical Narrative:   Patient is scheduled to have surgery for I&D today has been Cocaine pos until today, Surgery has been postponed TOC to continue to follow for assistance and DC planning    Expected Discharge Plan: Home/Self Care Barriers to Discharge: Continued Medical Work up  Expected Discharge Plan and Services   Discharge Planning Services: CM Consult   Living arrangements for the past 2 months: Single Family Home                                       Social Determinants of Health (SDOH) Interventions SDOH Screenings   Food Insecurity: No Food Insecurity (05/27/2022)  Housing: Medium Risk (05/27/2022)  Transportation Needs: No Transportation Needs (05/27/2022)  Utilities: Not At Risk (05/27/2022)  Tobacco Use: High Risk (05/27/2022)    Readmission Risk Interventions     No data to display

## 2022-05-31 NOTE — Transfer of Care (Signed)
Immediate Anesthesia Transfer of Care Note  Patient: Erik Barker  Procedure(s) Performed: INCISION AND DRAINAGE (Right) BONE BIOPSY (Right)  Patient Location: PACU  Anesthesia Type:General  Level of Consciousness: drowsy  Airway & Oxygen Therapy: Patient Spontanous Breathing and Patient connected to face mask oxygen  Post-op Assessment: Report given to RN and Post -op Vital signs reviewed and stable  Post vital signs: Reviewed and stable  Last Vitals:  Vitals Value Taken Time  BP 125/83 05/31/22 1315  Temp 36.2 C 05/31/22 1315  Pulse 69 05/31/22 1319  Resp 17 05/31/22 1319  SpO2 100 % 05/31/22 1319  Vitals shown include unvalidated device data.  Last Pain:  Vitals:   05/31/22 1315  TempSrc:   PainSc: 0-No pain         Complications: No notable events documented.

## 2022-05-31 NOTE — Interval H&P Note (Signed)
History and Physical Interval Note:  05/31/2022 9:51 AM  Erik Barker  has presented today for surgery, with the diagnosis of Right I and D with Bone Biopsy.  The various methods of treatment have been discussed with the patient and family. After consideration of risks, benefits and other options for treatment, the patient has consented to  Procedure(s): INCISION AND DRAINAGE (Right) BONE BIOPSY (Right) as a surgical intervention.  The patient's history has been reviewed, patient examined, no change in status, stable for surgery.  I have reviewed the patient's chart and labs.  Questions were answered to the patient's satisfaction.     Edrick Kins

## 2022-05-31 NOTE — Plan of Care (Signed)
  Problem: Activity: ?Goal: Risk for activity intolerance will decrease ?Outcome: Progressing ?  ?Problem: Nutrition: ?Goal: Adequate nutrition will be maintained ?Outcome: Progressing ?  ?Problem: Elimination: ?Goal: Will not experience complications related to urinary retention ?Outcome: Progressing ?  ?Problem: Safety: ?Goal: Ability to remain free from injury will improve ?Outcome: Progressing ?  ?

## 2022-05-31 NOTE — Progress Notes (Signed)
Progress Note   Patient: Erik Barker [id number] DOB: [birth year] DOA: 05/27/2022     4 DOS: the patient was seen and examined on 05/31/2022   Brief hospital course: 52 y.o. male with medical history significant of polysubstance abuse, hepatitis C, recent osteomyelitis complicated by MSSA bacteremia  (December 2023).  Patient signed out [place] back in December and oral antibiotics long course was prescribed.  He states he took these medications and his foot got better.  He did not follow-up with any physicians.  He presents to the ED due to foot pain.   Erik Barker states that his foot began to swell and turn red approximately 10 days ago. Since then, it has been gradually worsening.  He has noticed occasional drainage from the bottom from a wound that initially occurred in November 2023.  He is uncertain if the drainage had an odor to it.  He notes that he frequently walks barefoot and wonders if this may have led to an infection coming back.  In addition, he had not followed up with any physician after his most recent hospitalization in December 2023 and he tried to remove his stitches himself but he could not get them all out.  He notes that the pain in his right foot has started to move upwards to above his ankle but he has not noticed any skin changes in the affected area.  He denies any other symptoms at this time including fever, chills, nausea, vomiting, abdominal pain, chest pain, shortness of breath.     2/19.  Surgery canceled today secondary to cocaine still on the urine toxicology. 2/20.  Urine toxicology negative for cocaine.  Stable for surgery.  Assessment and Plan: * Septic arthritis ([place]) Abscess and osteomyelitis.  Appreciate infectious disease consultation.  Podiatry to do surgery today.  Blood cultures from 2/16 are negative.  Osteomyelitis of ankle or foot, acute, right (HCC) Osteomyelitis right foot.  Also seen on prior CT scans.  Podiatry to do surgery  today.  Hepatitis C On last admission, hepatitis C viral load was elevated.  Patient is interested in treatment but must follow-up.   Polysubstance abuse [facility]) Patient states that he uses a variety of substances, generally when he is with his friends.  Most recent intravenous use was approximately 2 weeks ago. Urine toxicology now negative for cocaine.  Surgery today.        Subjective: Patient seen this morning prior to surgery.  Feeling okay.  Some pain in his foot.  No drainage from the area.  Physical Exam: Vitals:   05/30/22 1401 05/31/22 0016 05/31/22 0730 05/31/22 1150  BP: 110/77 112/69 121/87 123/77  Pulse: 77 (!) 57 70 (!) 59  Resp: 17 16 17 16  $ Temp: 98.3 F (36.8 C)  97.7 F (36.5 C) 97.6 F (36.4 C)  TempSrc:    Oral  SpO2: 100% 98% 100% 100%  Weight:      Height:       Physical Exam HENT:     Head: Normocephalic.  Eyes:     General: Lids are normal.     Conjunctiva/sclera: Conjunctivae normal.  Cardiovascular:     Rate and Rhythm: Normal rate and regular rhythm.     Heart sounds: Normal heart sounds, S1 normal and S2 normal.  Pulmonary:     Breath sounds: No decreased breath sounds, wheezing, rhonchi or rales.  Abdominal:     Palpations: Abdomen is soft.     Tenderness: There is no abdominal  tenderness.  Musculoskeletal:     Comments: Slight erythema foot.  Looks like there is 2 stitches in his foot.  Skin:    General: Skin is warm.     Findings: No rash.     Comments: Swelling right foot.  Neurological:     Mental Status: He is alert and oriented to person, place, and time.     Data Reviewed: Creatinine 0.73 urine toxicology negative for cocaine.   Disposition: Status is: Inpatient Remains inpatient appropriate because: Will need to follow-up on culture results for antibiotic choice.  Likely will need long-term IV antibiotics.   Planned Discharge Destination: Home    Time spent: 28 minutes  Author: Loletha Grayer, MD 05/31/2022  12:35 PM  For on call review [email or web address].

## 2022-05-31 NOTE — Plan of Care (Signed)
  Problem: Clinical Measurements: Goal: Ability to maintain clinical measurements within normal limits will improve Outcome: Progressing Goal: Diagnostic test results will improve Outcome: Progressing Goal: Cardiovascular complication will be avoided Outcome: Progressing   Problem: Nutrition: Goal: Adequate nutrition will be maintained Outcome: Progressing   Problem: Elimination: Goal: Will not experience complications related to bowel motility Outcome: Progressing Goal: Will not experience complications related to urinary retention Outcome: Progressing

## 2022-05-31 NOTE — Progress Notes (Incomplete)
   Date of Admission:  05/27/2022   Total days of antibiotics ***        Day ***        Day ***        Day ***   ID: Erik Barker is a 52 y.o. male with  *** Principal Problem:   Septic arthritis (HCC) Active Problems:   Osteomyelitis of ankle or foot, acute, right (HCC)   Polysubstance abuse (HCC)   Hepatitis C   Abscess of right foot    Subjective: ***  Medications:   enoxaparin (LOVENOX) injection  40 mg Subcutaneous Q24H   polyethylene glycol  17 g Oral Daily   sodium chloride flush  3 mL Intravenous Q12H    Objective: Vital signs in last 24 hours: Temp:  [97 F (36.1 C)-97.7 F (36.5 C)] 97.7 F (36.5 C) (02/20 1356) Pulse Rate:  [57-73] 66 (02/20 1356) Resp:  [12-18] 16 (02/20 1356) BP: (110-125)/(69-87) 110/80 (02/20 1356) SpO2:  [98 %-100 %] 100 % (02/20 1356)  LDA Foley Central lines Other catheters  PHYSICAL EXAM:  General: Alert, cooperative, no distress, appears stated age.  Head: Normocephalic, without obvious abnormality, atraumatic. Eyes: Conjunctivae clear, anicteric sclerae. Pupils are equal ENT Nares normal. No drainage or sinus tenderness. Lips, mucosa, and tongue normal. No Thrush Neck: Supple, symmetrical, no adenopathy, thyroid: non tender no carotid bruit and no JVD. Back: No CVA tenderness. Lungs: Clear to auscultation bilaterally. No Wheezing or Rhonchi. No rales. Heart: Regular rate and rhythm, no murmur, rub or gallop. Abdomen: Soft, non-tender,not distended. Bowel sounds normal. No masses Extremities: atraumatic, no cyanosis. No edema. No clubbing Skin: No rashes or lesions. Or bruising Lymph: Cervical, supraclavicular normal. Neurologic: Grossly non-focal  Lab Results Recent Labs    05/29/22 0420 05/30/22 0642  CREATININE 0.86 0.73   Liver Panel No results for input(s): "PROT", "ALBUMIN", "AST", "ALT", "ALKPHOS", "BILITOT", "BILIDIR", "IBILI" in the last 72 hours. Sedimentation Rate No results for input(s):  "ESRSEDRATE" in the last 72 hours. C-Reactive Protein No results for input(s): "CRP" in the last 72 hours.  Microbiology:  Studies/Results: No results found.   Assessment/Plan: ***

## 2022-05-31 NOTE — Brief Op Note (Signed)
05/31/2022  1:11 PM  PATIENT:  Erik Barker  52 y.o. male  PRE-OPERATIVE DIAGNOSIS:  Right I and D with Bone Biopsy  POST-OPERATIVE DIAGNOSIS:  Right I and D with Bone Biopsy  PROCEDURE:  Procedure(s): INCISION AND DRAINAGE (Right) BONE BIOPSY (Right)  SURGEON:  Surgeon(s) and Role:    Edrick Kins, DPM - Primary  PHYSICIAN ASSISTANT:   ASSISTANTS: none   ANESTHESIA:   local and MAC  EBL:  minimal   BLOOD ADMINISTERED:none  DRAINS: none   LOCAL MEDICATIONS USED:  MARCAINE   , LIDOCAINE , and Amount: 201 ml  SPECIMEN:  Source of Specimen:  Bone biopsy medial cuneiform sent for BOTH culture and pathology  DISPOSITION OF SPECIMEN:  PATHOLOGY  COUNTS:  YES  TOURNIQUET:   Total Tourniquet Time Documented: Ankle (Right) - 8 minutes Total: Ankle (Right) - 8 minutes   DICTATION: .Viviann Spare Dictation  PLAN OF CARE: Admit to inpatient   PATIENT DISPOSITION:  PACU - hemodynamically stable.   Delay start of Pharmacological VTE agent (>24hrs) due to surgical blood loss or risk of bleeding: not applicable

## 2022-05-31 NOTE — Anesthesia Preprocedure Evaluation (Addendum)
Anesthesia Evaluation  Patient identified by MRN, date of birth, ID band Patient awake    Reviewed: Allergy & Precautions, NPO status , Patient's Chart, lab work & pertinent test results  History of Anesthesia Complications Negative for: history of anesthetic complications  Airway Mallampati: IV   Neck ROM: Full    Dental  (+) Edentulous Upper, Edentulous Lower   Pulmonary Current Smoker (3/4 ppd) and Patient abstained from smoking.   Pulmonary exam normal breath sounds clear to auscultation       Cardiovascular Exercise Tolerance: Good negative cardio ROS Normal cardiovascular exam Rhythm:Regular Rate:Normal     Neuro/Psych Polysubstance use disorder including cocaine, heroin, methamphetamine    GI/Hepatic negative GI ROS,,,(+) Hepatitis -, C  Endo/Other  negative endocrine ROS    Renal/GU negative Renal ROS     Musculoskeletal   Abdominal   Peds  Hematology negative hematology ROS (+)   Anesthesia Other Findings   Reproductive/Obstetrics                             Anesthesia Physical Anesthesia Plan  ASA: 3  Anesthesia Plan: General   Post-op Pain Management:    Induction: Intravenous  PONV Risk Score and Plan: 1 and Ondansetron, Treatment may vary due to age or medical condition, Propofol infusion and TIVA  Airway Management Planned: Natural Airway  Additional Equipment:   Intra-op Plan:   Post-operative Plan:   Informed Consent: I have reviewed the patients History and Physical, chart, labs and discussed the procedure including the risks, benefits and alternatives for the proposed anesthesia with the patient or authorized representative who has indicated his/her understanding and acceptance.       Plan Discussed with: CRNA  Anesthesia Plan Comments: (LMA/GETA backup discussed.  Patient consented for risks of anesthesia including but not limited to:  - adverse  reactions to medications - damage to eyes, teeth, lips or other oral mucosa - nerve damage due to positioning  - sore throat or hoarseness - damage to heart, brain, nerves, lungs, other parts of body or loss of life  Informed patient about role of CRNA in peri- and intra-operative care.  Patient voiced understanding.)       Anesthesia Quick Evaluation

## 2022-05-31 NOTE — Op Note (Signed)
OPERATIVE REPORT Patient name: Erik Barker MRN: [id number] DOB: [birth year]  DOS: 05/31/22  Preop Dx: Osteomyelitis right foot.  Abscess right foot Postop Dx: same  Procedure:  1.  Bone biopsy right foot.  Incision and drainage right foot  Surgeon: Edrick Kins DPM  Anesthesia: 50-50 mixture of 2% lidocaine plain with 0.5% Marcaine plain totaling 20 mL infiltrated in the patient's right lower extremity ankle block fashion  Hemostasis: Calf tourniquet inflated to a pressure of 229mHg after esmarch exsanguination   EBL: Minimal mL Materials: None  Injectables: None Pathology: Bone biopsy medial cuneiform sent for BOTH path and culture  Condition: The patient tolerated the procedure and anesthesia well. No complications noted or reported   Justification for procedure: The patient is a 52y.o. 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 All conservative modalities of been unsuccessful in providing any sort of satisfactory alleviation of symptoms with the patient. The patient was told benefits as well as possible side effects of the surgery. The patient consented for surgical correction. The patient consent form was reviewed. All patient questions were answered. No guarantees were expressed or implied. The patient and the surgeon both signed the patient consent form with the witness present and placed in the patient's chart.   Procedure in Detail: The patient was brought to the operating room, placed in the operating table in the supine position at which time an aseptic scrub and drape were performed about the patient's respective lower extremity after anesthesia was induced as described above. Attention was then directed to the surgical area where procedure number one commenced.  Procedure #1: Incision and drainage with bone biopsy right foot The remaining sutures were removed along the previous incision  site of the right foot.  A 2 cm linear longitudinal skin incision was planned and made overlying the area of most inflammation of the right foot.  Incision carried down to the level of bone.  Minimal purulence was noted.  There was serous drainage coming from the area but no purulence.  No abscess noted.  There was small hematoma that was expressed from the incision site.  Within the incision site previously mentioned a Jamshidi bone biopsy needle was utilized to harvest 2 separate samples of the medial cuneiform.  One specimen was sent to pathology for culture and a separate specimen was sent for pathology.  After harvesting of the bone biopsy for culture and path the area was irrigated and primary closure was achieved using 2-0 nylon suture.  Dry sterile compressive dressings were then applied to all previously mentioned incision sites about the patient's lower extremity. The tourniquet which was used for hemostasis was deflated. All normal neurovascular responses including pink color and warmth returned all the digits of patient's lower extremity.  The patient was then transferred from the operating room to the recovery room having tolerated the procedure and anesthesia well. All vital signs are stable. After a brief stay in the recovery room the patient was readmitted to inpatient room with postoperative orders placed.    IMPRESSION: Osseous bone of the medial cuneiform did appear very soft and friable during the bone biopsy.  Concern for chronic advanced osteomyelitis of this area.  BEdrick Kins DPM [facility]  Dr. BEdrick Kins DPM    [address]  [place], [place] [address]                Office [phone]  Fax [phone]   

## 2022-05-31 NOTE — Anesthesia Postprocedure Evaluation (Signed)
Anesthesia Post Note  Patient: ADEL ALLEMANG  Procedure(s) Performed: INCISION AND DRAINAGE RIGHT FOOT (Right) BONE BIOPSY RIGHT FOOT (Right)  Patient location during evaluation: PACU Anesthesia Type: General Level of consciousness: awake and alert, oriented and patient cooperative Pain management: pain level controlled Vital Signs Assessment: post-procedure vital signs reviewed and stable Respiratory status: spontaneous breathing, nonlabored ventilation and respiratory function stable Cardiovascular status: blood pressure returned to baseline and stable Postop Assessment: adequate PO intake Anesthetic complications: no   No notable events documented.   Last Vitals:  Vitals:   05/31/22 1338 05/31/22 1356  BP: 121/74 110/80  Pulse: (!) 59 66  Resp: 18 16  Temp: (!) 36.1 C 36.5 C  SpO2: 100% 100%    Last Pain:  Vitals:   05/31/22 1338  TempSrc:   PainSc: 0-No pain                 Darrin Nipper

## 2022-06-01 ENCOUNTER — Encounter: Payer: Self-pay | Admitting: Podiatry

## 2022-06-01 LAB — CBC
HCT: 41.1 % (ref 39.0–52.0)
Hemoglobin: 13.4 g/dL (ref 13.0–17.0)
MCH: 29 pg (ref 26.0–34.0)
MCHC: 32.6 g/dL (ref 30.0–36.0)
MCV: 89 fL (ref 80.0–100.0)
Platelets: 291 10*3/uL (ref 150–400)
RBC: 4.62 MIL/uL (ref 4.22–5.81)
RDW: 14.6 % (ref 11.5–15.5)
WBC: 4.5 10*3/uL (ref 4.0–10.5)
nRBC: 0 % (ref 0.0–0.2)

## 2022-06-01 LAB — BASIC METABOLIC PANEL
Anion gap: 7 (ref 5–15)
BUN: 19 mg/dL (ref 6–20)
CO2: 24 mmol/L (ref 22–32)
Calcium: 8.4 mg/dL — ABNORMAL LOW (ref 8.9–10.3)
Chloride: 102 mmol/L (ref 98–111)
Creatinine, Ser: 0.8 mg/dL (ref 0.61–1.24)
GFR, Estimated: >60 mL/min (ref >60)
Glucose, Bld: 155 mg/dL — ABNORMAL HIGH (ref 70–99)
Potassium: 3.5 mmol/L (ref 3.5–5.1)
Sodium: 133 mmol/L — ABNORMAL LOW (ref 135–145)

## 2022-06-01 LAB — HEMOGLOBIN A1C
Hgb A1c MFr Bld: 4.8 % (ref 4.8–5.6)
Mean Plasma Glucose: 91.06 mg/dL

## 2022-06-01 NOTE — Plan of Care (Signed)

## 2022-06-01 NOTE — Plan of Care (Signed)
  Problem: Education: Goal: Knowledge of General Education information will improve Description: Including pain rating scale, medication(s)/side effects and non-pharmacologic comfort measures Outcome: Progressing   Problem: Health Behavior/Discharge Planning: Goal: Ability to manage health-related needs will improve Outcome: Progressing   Problem: Clinical Measurements: Goal: Ability to maintain clinical measurements within normal limits will improve Outcome: Progressing Goal: Diagnostic test results will improve Outcome: Progressing Goal: Respiratory complications will improve Outcome: Progressing Goal: Cardiovascular complication will be avoided Outcome: Progressing   Problem: Activity: Goal: Risk for activity intolerance will decrease Outcome: Progressing   Problem: Nutrition: Goal: Adequate nutrition will be maintained Outcome: Progressing   Problem: Elimination: Goal: Will not experience complications related to bowel motility Outcome: Progressing Goal: Will not experience complications related to urinary retention Outcome: Progressing   Problem: Pain Managment: Goal: General experience of comfort will improve Outcome: Progressing

## 2022-06-01 NOTE — Progress Notes (Signed)
Progress Note   Patient: Erik Barker [id number] DOB: [birth year] DOA: 05/27/2022     5 DOS: the patient was seen and examined on 06/01/2022   Brief hospital course: 52 y.o. male with medical history significant of polysubstance abuse, hepatitis C, recent osteomyelitis complicated by MSSA bacteremia  (December 2023).  Patient signed out [place] back in December and oral antibiotics long course was prescribed.  He states he took these medications and his foot got better.  He did not follow-up with any physicians.  He presents to the ED due to foot pain.   Mr. Lipp states that his foot began to swell and turn red approximately 10 days ago. Since then, it has been gradually worsening.  He has noticed occasional drainage from the bottom from a wound that initially occurred in November 2023.  He is uncertain if the drainage had an odor to it.  He notes that he frequently walks barefoot and wonders if this may have led to an infection coming back.  In addition, he had not followed up with any physician after his most recent hospitalization in December 2023 and he tried to remove his stitches himself but he could not get them all out.  He notes that the pain in his right foot has started to move upwards to above his ankle but he has not noticed any skin changes in the affected area.  He denies any other symptoms at this time including fever, chills, nausea, vomiting, abdominal pain, chest pain, shortness of breath.     2/19.  Surgery canceled today secondary to cocaine still on the urine toxicology. 2/20.  Urine toxicology negative for cocaine.  Stable for surgery. 2/21: S/p incision and drainage along with bone biopsy by podiatry yesterday.  Labs with mild  preliminary bone cultures with rare Staph aureus.  Blood cultures during current hospitalization remain negative in 4 days.  Patient was threatening to leave AMA.  Assessment and Plan: * Septic arthritis ([place]) Abscess and  osteomyelitis.  Appreciate infectious disease consultation.  Blood cultures from 2/16 remain negative.  S/p incision and drainage and bone biopsy.  Growing rare Staph aureus-pending susceptibility. -ID is on board-will appreciate their help to determine the duration and type of antibiotic as patient is threatening to leave AMA. -Continue with broad-spectrum antibiotics for now  Osteomyelitis of ankle or foot, acute, right (HCC) Osteomyelitis right foot.  Also seen on prior CT scans.  Management as mentioned above  Hepatitis C On last admission, hepatitis C viral load was elevated.  Patient is interested in treatment but must follow-up.   Polysubstance abuse [facility]) Patient states that he uses a variety of substances, generally when he is with his friends.  Most recent intravenous use was approximately 2 weeks ago. Urine toxicology now negative for cocaine.  Surgery today.        Subjective: Patient was seen and examined today.  He wants to leave AMA stating that he cannot stay in hospital that long.  Physical Exam: Vitals:   05/31/22 2035 06/01/22 0039 06/01/22 0407 06/01/22 0816  BP: 122/78 116/71 124/76 120/63  Pulse: 73 66 64 63  Resp: 18 18 18 16  $ Temp: 97.8 F (36.6 C) 97.6 F (36.4 C) 97.9 F (36.6 C) 98 F (36.7 C)  TempSrc:      SpO2: 100% 100% 100% 100%  Weight:      Height:       General.     In no acute distress. Pulmonary.  Lungs  clear bilaterally, normal respiratory effort. CV.  Regular rate and rhythm, no JVD, rub or murmur. Abdomen.  Soft, nontender, nondistended, BS positive. CNS.  Alert and oriented .  No focal neurologic deficit. Extremities.  No edema, no cyanosis, pulses intact and symmetrical.  Right foot with dressing Psychiatry.  Judgment and insight appears normal.    Data Reviewed: Prior data reviewed  Disposition: Status is: Inpatient Remains inpatient appropriate because: Will need to follow-up on culture results for antibiotic choice.   Likely will need long-term IV antibiotics.   Planned Discharge Destination: Home  DVT prophylaxis.  Lovenox Time spent: 40 minutes  This record has been created using Systems analyst. Errors have been sought and corrected,but may not always be located. Such creation errors do not reflect on the standard of care.   Author: Lorella Nimrod, MD 06/01/2022 4:02 PM  For on call review [email or web address].

## 2022-06-01 NOTE — Progress Notes (Signed)
   Date of Admission:  05/27/2022     ID: Erik Barker is a 52 y.o. male  Principal Problem:   Septic arthritis ([place]) Active Problems:   Osteomyelitis of ankle or foot, acute, right (HCC)   Polysubstance abuse (HCC)   Hepatitis C   Abscess of right foot    Subjective: Pt ambulating in the room Has removed dressing off his foot Also took a shower   Medications:   enoxaparin (LOVENOX) injection  40 mg Subcutaneous Q24H   polyethylene glycol  17 g Oral Daily   sodium chloride flush  3 mL Intravenous Q12H    Objective: Vital signs in last 24 hours: Temp:  [97 F (36.1 C)-98 F (36.7 C)] 98 F (36.7 C) (02/21 0816) Pulse Rate:  [59-73] 63 (02/21 0816) Resp:  [16-18] 16 (02/21 0816) BP: (110-124)/(63-80) 120/63 (02/21 0816) SpO2:  [100 %] 100 % (02/21 0816)    PHYSICAL EXAM:  General: Alert, cooperative, no distress, appears stated age.  Lungs: Clear to auscultation bilaterally. No Wheezing or Rhonchi. No rales. Heart: Regular rate and rhythm, no murmur, rub or gallop. Abdomen: Soft, non-tender,not distended. Bowel sounds normal. No masses Extremities: Rt foot- -I/D site has 2 sutures Some swelling  Skin: No rashes or lesions. Or bruising Lymph: Cervical, supraclavicular normal. Neurologic: Grossly non-focal  Lab Results Recent Labs    05/30/22 0642 06/01/22 0625  WBC  --  4.5  HGB  --  13.4  HCT  --  41.1  NA  --  133*  K  --  3.5  CL  --  102  CO2  --  24  BUN  --  19  CREATININE 0.73 0.80  Microbiology:  ESR 2/16- 30 CRP 5.8  Studies/Results: Evolving septic arthritis and periarticular osteomyelitis of the first TMT joint with bony erosion and new fluid collection suspicious for abscess along the medial aspect of the joint measuring 1.6 x 0.6 x 1.9 cm.   Assessment/Plan: Rt foot Infection with septic arthritis of the first TMT joint and osteomyelitis and abscess Underwent I/D and bone biopsy Lingering infection since Nov 2023 after  injury Because of poor adherence and compliance and leaving AMA many times may not have been adequately treated Last received 4 weeks of keflex in Dec/Jan  Prelim - Staph aureus  Continue vanco and cefepime'until culture finalizes    H/o MSSA bacteremia NOV 2023 Repeat blood culture has been neg on Dec Blood sent for culture this admisison   ?Polysubsatnce use   HEPC chronic infection- will need Rx as OP  Explained to patient that he needs to stay in the hospital until the results ( both culture and pathology ) are back so that we can have a plan .

## 2022-06-01 NOTE — TOC Progression Note (Signed)
Transition of Care [facility]) - Progression Note    Patient Details  Name: Erik Barker MRN: [id number] Date of Birth: 1970/10/13  Transition of Care [facility]) CM/SW [place], RN Phone Number: 06/01/2022, 9:01 AM  Clinical Narrative:     TOC to continue to follow and assist with DC planning, the patient had a bone biopsy Yesterday, he has a full time job at [employer], he reported that he can afford his medications and has good support at home, he is to start Substance Abuse classes in [place] soon  Expected Discharge Plan: Home/Self Care Barriers to Discharge: Continued Medical Work up  Expected Discharge Plan and Services   Discharge Planning Services: CM Consult   Living arrangements for the past 2 months: Single Family Home                                       Social Determinants of Health (SDOH) Interventions SDOH Screenings   Food Insecurity: No Food Insecurity (05/27/2022)  Housing: Medium Risk (05/27/2022)  Transportation Needs: No Transportation Needs (05/27/2022)  Utilities: Not At Risk (05/27/2022)  Tobacco Use: High Risk (06/01/2022)    Readmission Risk Interventions     No data to display

## 2022-06-02 ENCOUNTER — Other Ambulatory Visit: Payer: Self-pay

## 2022-06-02 LAB — CULTURE, BLOOD (ROUTINE X 2)
Culture: NO GROWTH
Culture: NO GROWTH
Special Requests: ADEQUATE

## 2022-06-02 LAB — SURGICAL PATHOLOGY

## 2022-06-02 MED ORDER — DEXTROSE 5 % IV SOLN
1500.0000 mg | INTRAVENOUS | Status: AC
  Administered 2022-06-02: 1500 mg via INTRAVENOUS
  Filled 2022-06-02: qty 75

## 2022-06-02 MED ORDER — CEPHALEXIN 500 MG PO CAPS
1000.0000 mg | ORAL_CAPSULE | Freq: Four times a day (QID) | ORAL | 0 refills | Status: AC
  Filled 2022-06-02: qty 124, 16d supply, fill #1
  Filled 2022-06-02: qty 100, 13d supply, fill #0

## 2022-06-02 MED ORDER — CIPROFLOXACIN HCL 500 MG PO TABS: 500.0000 mg | ORAL_TABLET | Freq: Two times a day (BID) | ORAL | 0 refills | Status: DC

## 2022-06-02 MED ORDER — MAGNESIUM HYDROXIDE 400 MG/5ML PO SUSP
30.0000 mL | Freq: Every day | ORAL | Status: DC | PRN
  Administered 2022-06-02: 30 mL via ORAL
  Filled 2022-06-02: qty 30

## 2022-06-02 MED ORDER — CIPROFLOXACIN HCL 500 MG PO TABS
500.0000 mg | ORAL_TABLET | Freq: Two times a day (BID) | ORAL | 0 refills | Status: AC
  Filled 2022-06-02: qty 40, 20d supply, fill #0
  Filled 2022-06-02: qty 16, 8d supply, fill #1

## 2022-06-02 MED ORDER — CEPHALEXIN 500 MG PO CAPS: 1000.0000 mg | ORAL_CAPSULE | Freq: Four times a day (QID) | ORAL | 0 refills | Status: DC

## 2022-06-02 NOTE — Plan of Care (Signed)
  Problem: Health Behavior/Discharge Planning: Goal: Ability to manage health-related needs will improve Outcome: Progressing   Problem: Clinical Measurements: Goal: Diagnostic test results will improve Outcome: Progressing   Problem: Activity: Goal: Risk for activity intolerance will decrease Outcome: Progressing   Problem: Nutrition: Goal: Adequate nutrition will be maintained Outcome: Progressing   Problem: Safety: Goal: Ability to remain free from injury will improve Outcome: Progressing

## 2022-06-02 NOTE — Discharge Summary (Signed)
Physician Discharge Summary   Patient: Erik Barker MRN: [id number] DOB: 05-Nov-1970  Admit date:     05/27/2022  Discharge date: 06/02/22  Discharge Physician: Lorella Nimrod   PCP: Pcp, No   Recommendations at discharge:  Patient is leaving AMA Need a follow-up with infectious disease. He also need to follow-up with podiatry in 1 week  Discharge Diagnoses: Principal Problem:   Septic arthritis ([place]) Active Problems:   Osteomyelitis of ankle or foot, acute, right ([place])   Polysubstance abuse ([place])   Hepatitis C   Hospital Course: 52 y.o. male with medical history significant of polysubstance abuse, hepatitis C, recent osteomyelitis complicated by MSSA bacteremia  (December 2023).  Patient signed out [place] back in December and oral antibiotics long course was prescribed.  He states he took these medications and his foot got better.  He did not follow-up with any physicians.  He presents to the ED due to foot pain.   [name] states that his foot began to swell and turn red approximately 10 days ago. Since then, it has been gradually worsening.  He has noticed occasional drainage from the bottom from a wound that initially occurred in November 2023.  He is uncertain if the drainage had an odor to it.  He notes that he frequently walks barefoot and wonders if this may have led to an infection coming back.  In addition, he had not followed up with any physician after his most recent hospitalization in December 2023 and he tried to remove his stitches himself but he could not get them all out.  He notes that the pain in his right foot has started to move upwards to above his ankle but he has not noticed any skin changes in the affected area.  He denies any other symptoms at this time including fever, chills, nausea, vomiting, abdominal pain, chest pain, shortness of breath.     2/19.  Surgery canceled today secondary to cocaine still on the urine toxicology. 2/20.  Urine  toxicology negative for cocaine.  Stable for surgery. 2/21: S/p incision and drainage along with bone biopsy by podiatry yesterday.  Labs with mild  preliminary bone cultures with rare Staph aureus.  Blood cultures during current hospitalization remain negative in 4 days.  Patient was threatening to leave AMA. 2/22: Vital stable.  Refusing most of the care including labs.  Susceptibility is not back yet. Patient becoming more irritable and eventually left AMA. Prescriptions for Keflex 1 g every 6 hourly and ciprofloxacin 500 mg twice daily for 4 weeks were sent to his pharmacy.  Ideally he should also get dalbavancin weekly as recommended by ID. Not sure whether he will do it based on his behavior.  Eventually agrees to get 1 dose of dalbavancin by ID.  They were advising weekly dalbavancin along with Keflex and ciprofloxacin based on prior culture sensitivities as new sensitivities are still not available.  Hopefully patient can follow-up.  Patient is very high risk for deterioration, readmission and losing a limb based on his behavior and not letting providers take care of it appropriately.  Assessment and Plan: * Septic arthritis ([place]) Abscess and osteomyelitis.  Appreciate infectious disease consultation.  Blood cultures from 2/16 remain negative.  S/p incision and drainage and bone biopsy.  Growing rare Staph aureus-pending susceptibility. -ID is on board-will appreciate their help to determine the duration and type of antibiotic as patient is threatening to leave AMA. -Continue with broad-spectrum antibiotics for now  Osteomyelitis of  ankle or foot, acute, right (HCC) Osteomyelitis right foot.  Also seen on prior CT scans.  Management as mentioned above  Hepatitis C On last admission, hepatitis C viral load was elevated.  Patient is interested in treatment but must follow-up.   Polysubstance abuse [facility]) Patient states that he uses a variety of substances, generally when he is with his  friends.  Most recent intravenous use was approximately 2 weeks ago. Urine toxicology now negative for cocaine.  Surgery today.    Consultants: Infectious disease, podiatry Procedures performed: Incision and drainage with bone biopsy. Disposition: Home Diet recommendation:  Discharge Diet Orders (From admission, onward)     Start     Ordered   06/02/22 0000  Diet - low sodium heart healthy        06/02/22 1124           Regular diet DISCHARGE MEDICATION: Allergies as of 06/02/2022   No Known Allergies      Medication List     STOP taking these medications    sulfamethoxazole-trimethoprim 800-160 MG tablet Commonly known as: BACTRIM DS       TAKE these medications    cephALEXin 500 MG capsule Commonly known as: Keflex Take 2 capsules (1,000 mg total) by mouth 4 (four) times daily for 28 days.   ciprofloxacin 500 MG tablet Commonly known as: Cipro Take 1 tablet (500 mg total) by mouth 2 (two) times daily for 28 days.   ibuprofen 800 MG tablet Commonly known as: ADVIL Take 1 tablet (800 mg total) by mouth every 8 (eight) hours as needed.        Discharge Exam: Filed Weights   05/27/22 1224  Weight: 74.8 kg   General.  Irritable and aggressive gentleman, in no acute distress. Pulmonary.  Lungs clear bilaterally, normal respiratory effort. CV.  Regular rate and rhythm, no JVD, rub or murmur. Abdomen.  Soft, nontender, nondistended, BS positive. CNS.  Alert and oriented .  No focal neurologic deficit. Extremities.  No edema, no cyanosis, pulses intact and symmetrical. Psychiatry.  Judgment and insight appears normal.   Condition at discharge: stable  The results of significant diagnostics from this hospitalization (including imaging, microbiology, ancillary and laboratory) are listed below for reference.   Imaging Studies: CT FOOT RIGHT W CONTRAST  Result Date: 05/27/2022 CLINICAL DATA:  Soft tissue infection suspected, foot, no prior imaging EXAM:  CT OF THE LOWER RIGHT EXTREMITY WITH CONTRAST TECHNIQUE: Multidetector CT imaging of the lower right extremity was performed according to the standard protocol following intravenous contrast administration. RADIATION DOSE REDUCTION: This exam was performed according to the departmental dose-optimization program which includes automated exposure control, adjustment of the mA and/or kV according to patient size and/or use of iterative reconstruction technique. CONTRAST:  158m OMNIPAQUE IOHEXOL 300 MG/ML  SOLN COMPARISON:  CT 03/30/2022 FINDINGS: Bones/Joint/Cartilage There are erosive changes at the first tarsometatarsal joint with developing bony fragmentation, progressed from prior exam, with increased sclerosis periosteal reaction along the proximal first metatarsal shaft. Unchanged tibialis anterior avulsion fracture. 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 Ligaments Suboptimally assessed by CT. Muscles and Tendons Unchanged tibialis anterior avulsion avulsion fracture with distal tendon thickening and hypodensity. Soft tissues There is a rim enhancing collection adjacent to the medial aspect of the first TMT joint (series 100 images 178-195). This area was previously ill-defined and not fluid density, this is likely an abscess and measures 1.6 x 0.6 x 1.9 cm (series 10, image 144, series 100, image 184). IMPRESSION:  Evolving septic arthritis and periarticular osteomyelitis of the first TMT joint with bony erosion and new fluid collection suspicious for abscess along the medial aspect of the joint measuring 1.6 x 0.6 x 1.9 cm. Electronically Signed   By: Maurine Simmering M.D.   On: 05/27/2022 15:34    Microbiology: Results for orders placed or performed during the hospital encounter of 05/27/22  Blood culture (routine x 2)     Status: None (Preliminary result)   Collection Time: 05/27/22  1:00 PM   Specimen: Right Antecubital; Blood  Result Value Ref Range Status   Specimen Description RIGHT  ANTECUBITAL  Final   Special Requests   Final    BOTTLES DRAWN AEROBIC AND ANAEROBIC Blood Culture adequate volume   Culture   Final    NO GROWTH 4 DAYS Performed at [facility], [address], [place], [place] [address]    Report Status PENDING  Incomplete  Blood culture (routine x 2)     Status: None (Preliminary result)   Collection Time: 05/27/22  1:00 PM   Specimen: Left Antecubital; Blood  Result Value Ref Range Status   Specimen Description LEFT ANTECUBITAL  Final   Special Requests   Final    BOTTLES DRAWN AEROBIC AND ANAEROBIC Blood Culture results may not be optimal due to an excessive volume of blood received in culture bottles   Culture   Final    NO GROWTH 4 DAYS Performed at [facility], [address] [place], [place] [address]    Report Status PENDING  Incomplete  Aerobic/Anaerobic Culture w Gram Stain (surgical/deep wound)     Status: None (Preliminary result)   Collection Time: 05/31/22  1:04 PM   Specimen: PATH Other; Tissue  Result Value Ref Range Status   Specimen Description   Final    TISSUE Performed at [facility], [address], [place], [place] [address]    Special Requests   Final    RT FOOT Performed at [facility], [address] [place], [place] [address]    Gram Stain   Final    NO WBC SEEN NO ORGANISMS SEEN Performed at [facility], [place] [address], [place], [place] [address]    Culture   Final    RARE STAPHYLOCOCCUS AUREUS SUSCEPTIBILITIES TO FOLLOW NO ANAEROBES ISOLATED; CULTURE IN PROGRESS FOR 5 DAYS    Report Status PENDING  Incomplete    Labs: CBC: Recent Labs  Lab 05/27/22 1300 05/28/22 0915 06/01/22 0625  WBC 8.0 6.8 4.5  NEUTROABS  --  4.8  --   HGB 13.8 13.0 13.4  HCT 43.3 39.9 41.1  MCV 89.5 88.3 89.0  PLT 302 288 [id number]   Basic Metabolic Panel: Recent Labs  Lab 05/27/22 1300 05/28/22 0915 05/29/22 0420 05/30/22 0642 06/01/22 0625  NA 134* 136  --   --  133*   K 3.3* 3.8  --   --  3.5  CL 100 106  --   --  102  CO2 21* 23  --   --  24  GLUCOSE 144* 159*  --   --  155*  BUN 15 11  --   --  19  CREATININE 0.87 0.76 0.86 0.73 0.80  CALCIUM 8.6* 8.3*  --   --  8.4*   Liver Function Tests: Recent Labs  Lab 05/28/22 0915  AST 41  ALT 40  ALKPHOS 78  BILITOT 0.6  PROT 6.5  ALBUMIN 3.0*   CBG:  No results for input(s): "GLUCAP" in the last 168 hours.  Discharge time spent: greater than 30 minutes.  This record has been created using Systems analyst. Errors have been sought and corrected,but may not always be located. Such creation errors do not reflect on the standard of care.   Signed: Lorella Nimrod, MD [facility] 06/02/2022

## 2022-06-02 NOTE — TOC Progression Note (Signed)
Transition of Care [facility]) - Progression Note    Patient Details  Name: ALBARA SCORZA MRN: [id number] Date of Birth: 09-Jul-1970  Transition of Care [facility]) CM/SW [place], RN Phone Number: 06/02/2022, 10:18 AM  Clinical Narrative:    TOC to continue to follow for needs.  Due to Poly Substance abuse he will not go home with a PICC line, ID to determine plan   Expected Discharge Plan: Home/Self Care Barriers to Discharge: Continued Medical Work up  Expected Discharge Plan and Services   Discharge Planning Services: CM Consult   Living arrangements for the past 2 months: Single Family Home                                       Social Determinants of Health (SDOH) Interventions SDOH Screenings   Food Insecurity: No Food Insecurity (05/27/2022)  Housing: Medium Risk (05/27/2022)  Transportation Needs: No Transportation Needs (05/27/2022)  Utilities: Not At Risk (05/27/2022)  Tobacco Use: High Risk (06/01/2022)    Readmission Risk Interventions     No data to display

## 2022-06-02 NOTE — Progress Notes (Signed)
  Subjective:  Patient ID: Erik Barker, male    DOB: 1970-06-26,  MRN: [id number]  A 52 y.o. male presents with right foot osteomyelitis status post bone biopsy with incision and drainage of the right foot.  Patient states is doing well.  He states that he is not getting his medication on time.  Denies any nausea fever chills vomiting minimal made pain to the foot Objective:   Vitals:   06/02/22 0027 06/02/22 0727  BP: 103/78 111/68  Pulse: 66 64  Resp: 18 17  Temp: 98 F (36.7 C) 97.7 F (36.5 C)  SpO2: 99% 100%   General AA&O x3. Normal mood and affect.  Vascular Dorsalis pedis and posterior tibial pulses 2/4 bilat. Brisk capillary refill to all digits. Pedal hair present.  Neurologic Epicritic sensation grossly intact.  Dermatologic Dressing was changed stitches are intact no signs of infection noted.  Redness has resolved.  Orthopedic: MMT 5/5 in dorsiflexion, plantarflexion, inversion, and eversion. Normal joint ROM without pain or crepitus.    Assessment & Plan:  Patient was evaluated and treated and all questions answered.  Right foot osteomyelitis status post bone biopsy with incision and drainage -All questions nd concerns were discussed with the patient in extensive detail -Awaiting speciation of cultures.  Infectious disease for recommendation of long-term antibiotics -Patient is okay to be discharged from podiatric standpoint -Weightbearing as tolerated with surgical shoe -Follow-up with Dr. Amalia Hailey 1 week from discharge -Podiatry signing off.  Please reconsult [place] as needed.  Felipa Furnace, DPM  Accessible via secure chat for questions or concerns.

## 2022-06-02 NOTE — Progress Notes (Signed)
Lab tech informed the [facility] that the patient refused the order blood works for this morning.

## 2022-06-02 NOTE — Progress Notes (Signed)
Pt discharged with belonging bags, discharge instructions, and pharmacist delivered antibiotics. Pt states " I can walk out of here". He does not want to be wheeled out and walked out independently. Transportation provided via family member.

## 2022-06-02 NOTE — Plan of Care (Signed)
  Problem: Education: Goal: Knowledge of General Education information will improve Description: Including pain rating scale, medication(s)/side effects and non-pharmacologic comfort measures Outcome: Progressing   Problem: Activity: Goal: Risk for activity intolerance will decrease Outcome: Progressing   Problem: Elimination: Goal: Will not experience complications related to bowel motility Outcome: Progressing   Problem: Safety: Goal: Ability to remain free from injury will improve Outcome: Progressing   Problem: Skin Integrity: Goal: Risk for impaired skin integrity will decrease Outcome: Progressing

## 2022-06-03 ENCOUNTER — Telehealth: Payer: Self-pay | Admitting: Pharmacist

## 2022-06-03 NOTE — Progress Notes (Signed)
Pharmacy - Outpatient antibiotic administration  Patient with recurrent foot wound.  Sent home on oral keflex and cipro.  Received a dose of dalbavancin '1500mg'$  IV x 1 2/22 prior to leave AMA.  He was aware of plan to receive up to 3 more weekly doses of dalbavancin as outpatient at [facility] same day surgery. I told him I would call when appointment time set-up with that department  Plan: Has appointment Friday 06/10/2022 at 10am to get next dose of antibiotic infusion.   Called patient x 2 today without answer and voice mailbox was full.  Also sent text asking him to call me back Patient to receive dalbavancin '1000mg'$  IV weekly x 3 doses on Fridays starting 3/1. Also checking CMP, CBC/d, ESR, CRP  Will continue to attempt calling patient   Doreene Eland, PharmD, BCPS, BCIDP Work Cell: [phone] 06/03/2022 4:36 PM

## 2022-06-05 LAB — AEROBIC/ANAEROBIC CULTURE W GRAM STAIN (SURGICAL/DEEP WOUND): Gram Stain: NONE SEEN

## 2022-06-06 ENCOUNTER — Telehealth: Payer: Self-pay | Admitting: Pharmacist

## 2022-06-06 NOTE — Progress Notes (Signed)
Pharmacy - Antimicrobial Stewardship  I was able to reach Erik Barker at [phone].  He is aware of appointment for IV antibiotics at [facility] Same Day surgery Friday 06/10/2022 at [place], PharmD, BCPS, BCIDP Work Cell: [phone] 06/06/2022 4:32 PM

## 2022-06-10 ENCOUNTER — Ambulatory Visit
Admission: RE | Admit: 2022-06-10 | Discharge: 2022-06-10 | Disposition: A | Discharge status: Home / Self Care | Payer: Self-pay | Source: Ambulatory Visit | Attending: Infectious Diseases | Admitting: Infectious Diseases

## 2022-06-10 DIAGNOSIS — L02611 Cutaneous abscess of right foot: Secondary | ICD-10-CM

## 2022-06-10 DIAGNOSIS — M009 Pyogenic arthritis, unspecified: Secondary | ICD-10-CM

## 2022-06-10 DIAGNOSIS — M86171 Other acute osteomyelitis, right ankle and foot: Secondary | ICD-10-CM

## 2022-06-10 MED ORDER — DEXTROSE 5 % IV SOLN
1000.0000 mg | INTRAVENOUS | Status: DC
  Filled 2022-06-10: qty 50

## 2022-06-11 NOTE — Progress Notes (Signed)
Pt called and stated that he would be here for medication IV infusion. Pt did not show. I called all his contacts and his cell phone multiple times with no answer. Notified MD also.

## 2022-06-14 ENCOUNTER — Inpatient Hospital Stay: Payer: Self-pay | Admitting: Infectious Diseases

## 2022-06-17 ENCOUNTER — Ambulatory Visit
Admission: RE | Admit: 2022-06-17 | Discharge: 2022-06-17 | Disposition: A | Discharge status: Home / Self Care | Payer: Self-pay | Source: Ambulatory Visit | Attending: Infectious Diseases | Admitting: Infectious Diseases

## 2022-06-17 DIAGNOSIS — L02611 Cutaneous abscess of right foot: Secondary | ICD-10-CM

## 2022-06-17 DIAGNOSIS — M869 Osteomyelitis, unspecified: Secondary | ICD-10-CM

## 2022-06-17 NOTE — Progress Notes (Signed)
Patient was scheduled to receive IV medication infusion; unsuccessfully attempted to reach patient several times, informed charge nurse

## 2022-06-21 ENCOUNTER — Inpatient Hospital Stay: Payer: Self-pay | Admitting: Infectious Diseases

## 2022-06-23 ENCOUNTER — Other Ambulatory Visit: Payer: Self-pay

## 2022-06-24 ENCOUNTER — Ambulatory Visit
Admission: RE | Admit: 2022-06-24 | Discharge: 2022-06-24 | Disposition: A | Discharge status: Home / Self Care | Payer: Self-pay | Source: Ambulatory Visit | Attending: Infectious Diseases | Admitting: Infectious Diseases

## 2022-06-24 DIAGNOSIS — L02611 Cutaneous abscess of right foot: Secondary | ICD-10-CM

## 2022-06-24 DIAGNOSIS — M869 Osteomyelitis, unspecified: Secondary | ICD-10-CM

## 2022-06-27 ENCOUNTER — Inpatient Hospital Stay: Admission: RE | Admit: 2022-06-27 | Payer: Self-pay | Source: Ambulatory Visit

## 2022-06-28 ENCOUNTER — Ambulatory Visit: Admission: RE | Admit: 2022-06-28 | Payer: MEDICAID | Source: Ambulatory Visit

## 2022-12-17 ENCOUNTER — Inpatient Hospital Stay
Admission: EM | Admit: 2022-12-17 | Discharge: 2022-12-23 | DRG: 501 | Disposition: A | Discharge status: Home / Self Care | Payer: Self-pay | Attending: Internal Medicine | Admitting: Internal Medicine

## 2022-12-17 ENCOUNTER — Other Ambulatory Visit: Payer: Self-pay

## 2022-12-17 DIAGNOSIS — F1010 Alcohol abuse, uncomplicated: Secondary | ICD-10-CM | POA: Diagnosis present

## 2022-12-17 DIAGNOSIS — T79A0XA Compartment syndrome, unspecified, initial encounter: Secondary | ICD-10-CM | POA: Diagnosis present

## 2022-12-17 DIAGNOSIS — Y900 Blood alcohol level of less than 20 mg/100 ml: Secondary | ICD-10-CM | POA: Diagnosis present

## 2022-12-17 DIAGNOSIS — F17210 Nicotine dependence, cigarettes, uncomplicated: Secondary | ICD-10-CM | POA: Diagnosis present

## 2022-12-17 DIAGNOSIS — T79A29A Traumatic compartment syndrome of unspecified lower extremity, initial encounter: Secondary | ICD-10-CM | POA: Diagnosis present

## 2022-12-17 DIAGNOSIS — T79A22A Traumatic compartment syndrome of left lower extremity, initial encounter: Principal | ICD-10-CM | POA: Diagnosis present

## 2022-12-17 DIAGNOSIS — K760 Fatty (change of) liver, not elsewhere classified: Secondary | ICD-10-CM | POA: Diagnosis present

## 2022-12-17 DIAGNOSIS — B1920 Unspecified viral hepatitis C without hepatic coma: Secondary | ICD-10-CM | POA: Diagnosis present

## 2022-12-17 DIAGNOSIS — F1110 Opioid abuse, uncomplicated: Secondary | ICD-10-CM | POA: Diagnosis present

## 2022-12-17 DIAGNOSIS — M79605 Pain in left leg: Secondary | ICD-10-CM

## 2022-12-17 DIAGNOSIS — E785 Hyperlipidemia, unspecified: Secondary | ICD-10-CM | POA: Diagnosis present

## 2022-12-17 DIAGNOSIS — Z8619 Personal history of other infectious and parasitic diseases: Secondary | ICD-10-CM

## 2022-12-17 DIAGNOSIS — F17290 Nicotine dependence, other tobacco product, uncomplicated: Secondary | ICD-10-CM | POA: Diagnosis present

## 2022-12-17 DIAGNOSIS — Z4889 Encounter for other specified surgical aftercare: Secondary | ICD-10-CM

## 2022-12-17 DIAGNOSIS — M6282 Rhabdomyolysis: Secondary | ICD-10-CM | POA: Diagnosis present

## 2022-12-17 DIAGNOSIS — E871 Hypo-osmolality and hyponatremia: Secondary | ICD-10-CM | POA: Diagnosis not present

## 2022-12-17 DIAGNOSIS — J449 Chronic obstructive pulmonary disease, unspecified: Secondary | ICD-10-CM | POA: Diagnosis present

## 2022-12-17 DIAGNOSIS — Z811 Family history of alcohol abuse and dependence: Secondary | ICD-10-CM

## 2022-12-17 DIAGNOSIS — M79A22 Nontraumatic compartment syndrome of left lower extremity: Principal | ICD-10-CM | POA: Diagnosis present

## 2022-12-17 DIAGNOSIS — F1410 Cocaine abuse, uncomplicated: Secondary | ICD-10-CM | POA: Diagnosis present

## 2022-12-17 DIAGNOSIS — F1210 Cannabis abuse, uncomplicated: Secondary | ICD-10-CM | POA: Diagnosis present

## 2022-12-17 DIAGNOSIS — I2510 Atherosclerotic heart disease of native coronary artery without angina pectoris: Secondary | ICD-10-CM | POA: Diagnosis present

## 2022-12-17 DIAGNOSIS — R7401 Elevation of levels of liver transaminase levels: Secondary | ICD-10-CM

## 2022-12-17 NOTE — ED Triage Notes (Signed)
Pt comes by EMS for lower back and left leg pain. Pt states his left foot is numb and tingling. Pt states he fell earlier today on the left side and does not know how he fell. Pt is A&Ox4. PMS intact.

## 2022-12-18 ENCOUNTER — Emergency Department: Payer: Self-pay

## 2022-12-18 ENCOUNTER — Encounter
Admission: EM | Disposition: A | Discharge status: Home / Self Care | Payer: Self-pay | Source: Home / Self Care | Attending: Internal Medicine

## 2022-12-18 ENCOUNTER — Encounter: Payer: Self-pay | Admitting: Internal Medicine

## 2022-12-18 ENCOUNTER — Emergency Department: Payer: Self-pay | Admitting: Anesthesiology

## 2022-12-18 ENCOUNTER — Other Ambulatory Visit: Payer: Self-pay

## 2022-12-18 DIAGNOSIS — T79A22A Traumatic compartment syndrome of left lower extremity, initial encounter: Secondary | ICD-10-CM | POA: Diagnosis present

## 2022-12-18 DIAGNOSIS — T79A0XA Compartment syndrome, unspecified, initial encounter: Secondary | ICD-10-CM | POA: Diagnosis present

## 2022-12-18 DIAGNOSIS — T79A29A Traumatic compartment syndrome of unspecified lower extremity, initial encounter: Secondary | ICD-10-CM | POA: Diagnosis present

## 2022-12-18 HISTORY — PX: FASCIECTOMY: SHX6525

## 2022-12-18 LAB — CK: Total CK: 45514 U/L — ABNORMAL HIGH (ref 49–397)

## 2022-12-18 LAB — URINALYSIS, COMPLETE (UACMP) WITH MICROSCOPIC
Bacteria, UA: NONE SEEN
Bilirubin Urine: NEGATIVE
Glucose, UA: 150 mg/dL — AB
Ketones, ur: NEGATIVE mg/dL
Leukocytes,Ua: NEGATIVE
Nitrite: NEGATIVE
Protein, ur: NEGATIVE mg/dL
Specific Gravity, Urine: 1.025 (ref 1.005–1.030)
pH: 5 (ref 5.0–8.0)

## 2022-12-18 LAB — COMPREHENSIVE METABOLIC PANEL WITH GFR
ALT: 307 U/L — ABNORMAL HIGH (ref 0–44)
AST: 1010 U/L — ABNORMAL HIGH (ref 15–41)
Albumin: 3.1 g/dL — ABNORMAL LOW (ref 3.5–5.0)
Alkaline Phosphatase: 83 U/L (ref 38–126)
Anion gap: 8 (ref 5–15)
BUN: 19 mg/dL (ref 6–20)
CO2: 24 mmol/L (ref 22–32)
Calcium: 8.1 mg/dL — ABNORMAL LOW (ref 8.9–10.3)
Chloride: 102 mmol/L (ref 98–111)
Creatinine, Ser: 0.65 mg/dL (ref 0.61–1.24)
GFR, Estimated: >60 mL/min
Glucose, Bld: 116 mg/dL — ABNORMAL HIGH (ref 70–99)
Potassium: 4.1 mmol/L (ref 3.5–5.1)
Sodium: 134 mmol/L — ABNORMAL LOW (ref 135–145)
Total Bilirubin: 0.5 mg/dL (ref 0.3–1.2)
Total Protein: 6.4 g/dL — ABNORMAL LOW (ref 6.5–8.1)

## 2022-12-18 LAB — CBC WITH DIFFERENTIAL/PLATELET
Abs Immature Granulocytes: 0.04 10*3/uL (ref 0.00–0.07)
Basophils Absolute: 0 10*3/uL (ref 0.0–0.1)
Basophils Relative: 0 %
Eosinophils Absolute: 0.1 10*3/uL (ref 0.0–0.5)
Eosinophils Relative: 0 %
HCT: 42.7 % (ref 39.0–52.0)
Hemoglobin: 14 g/dL (ref 13.0–17.0)
Immature Granulocytes: 0 %
Lymphocytes Relative: 10 %
Lymphs Abs: 1.2 10*3/uL (ref 0.7–4.0)
MCH: 29 pg (ref 26.0–34.0)
MCHC: 32.8 g/dL (ref 30.0–36.0)
MCV: 88.4 fL (ref 80.0–100.0)
Monocytes Absolute: 0.4 10*3/uL (ref 0.1–1.0)
Monocytes Relative: 4 %
Neutro Abs: 10.3 10*3/uL — ABNORMAL HIGH (ref 1.7–7.7)
Neutrophils Relative %: 86 %
Platelets: 233 10*3/uL (ref 150–400)
RBC: 4.83 MIL/uL (ref 4.22–5.81)
RDW: 15.2 % (ref 11.5–15.5)
WBC: 12 10*3/uL — ABNORMAL HIGH (ref 4.0–10.5)
nRBC: 0 % (ref 0.0–0.2)

## 2022-12-18 LAB — TYPE AND SCREEN
ABO/RH(D): O POS
Antibody Screen: NEGATIVE

## 2022-12-18 LAB — ETHANOL: Alcohol, Ethyl (B): <10 mg/dL (ref <10)

## 2022-12-18 LAB — PROTIME-INR
INR: 1.1 (ref 0.8–1.2)
Prothrombin Time: 14.2 s (ref 11.4–15.2)

## 2022-12-18 LAB — ACETAMINOPHEN LEVEL: Acetaminophen (Tylenol), Serum: <10 ug/mL — ABNORMAL LOW (ref 10–30)

## 2022-12-18 SURGERY — FASCIECTOMY, PALM
Anesthesia: General | Laterality: Left

## 2022-12-18 MED ORDER — MORPHINE SULFATE (PF) 4 MG/ML IV SOLN: 4.0000 mg | Freq: Once | INTRAVENOUS | Status: DC

## 2022-12-18 MED ORDER — DEXAMETHASONE SODIUM PHOSPHATE 10 MG/ML IJ SOLN
INTRAMUSCULAR | Status: AC
  Filled 2022-12-18: qty 1

## 2022-12-18 MED ORDER — THIAMINE MONONITRATE 100 MG PO TABS
100.0000 mg | ORAL_TABLET | Freq: Every day | ORAL | Status: DC
  Administered 2022-12-18 – 2022-12-23 (×6): 100 mg via ORAL
  Filled 2022-12-18 (×6): qty 1

## 2022-12-18 MED ORDER — TRAZODONE HCL 50 MG PO TABS
25.0000 mg | ORAL_TABLET | Freq: Every evening | ORAL | Status: DC | PRN
  Administered 2022-12-19 – 2022-12-20 (×2): 25 mg via ORAL
  Filled 2022-12-18 (×2): qty 1

## 2022-12-18 MED ORDER — SODIUM CHLORIDE 0.9 % IV SOLN: INTRAVENOUS | Status: DC

## 2022-12-18 MED ORDER — ENOXAPARIN SODIUM 40 MG/0.4ML IJ SOSY
40.0000 mg | PREFILLED_SYRINGE | INTRAMUSCULAR | Status: DC
  Administered 2022-12-18 – 2022-12-22 (×5): 40 mg via SUBCUTANEOUS
  Filled 2022-12-18 (×5): qty 0.4

## 2022-12-18 MED ORDER — PHENYLEPHRINE HCL-NACL 20-0.9 MG/250ML-% IV SOLN
INTRAVENOUS | Status: AC
  Filled 2022-12-18: qty 250

## 2022-12-18 MED ORDER — ROCURONIUM BROMIDE 100 MG/10ML IV SOLN
INTRAVENOUS | Status: DC | PRN
  Administered 2022-12-18: 30 mg via INTRAVENOUS
  Administered 2022-12-18: 10 mg via INTRAVENOUS

## 2022-12-18 MED ORDER — ACETAMINOPHEN 10 MG/ML IV SOLN: 1000.0000 mg | Freq: Once | INTRAVENOUS | Status: DC | PRN

## 2022-12-18 MED ORDER — MIDAZOLAM HCL 2 MG/2ML IJ SOLN
INTRAMUSCULAR | Status: AC
  Filled 2022-12-18: qty 2

## 2022-12-18 MED ORDER — PHENYLEPHRINE 80 MCG/ML (10ML) SYRINGE FOR IV PUSH (FOR BLOOD PRESSURE SUPPORT)
PREFILLED_SYRINGE | INTRAVENOUS | Status: DC | PRN
  Administered 2022-12-18: 160 ug via INTRAVENOUS
  Administered 2022-12-18: 80 ug via INTRAVENOUS
  Administered 2022-12-18 (×3): 160 ug via INTRAVENOUS

## 2022-12-18 MED ORDER — KETOROLAC TROMETHAMINE 30 MG/ML IJ SOLN
30.0000 mg | Freq: Once | INTRAMUSCULAR | Status: AC
  Administered 2022-12-18: 30 mg via INTRAVENOUS
  Filled 2022-12-18: qty 1

## 2022-12-18 MED ORDER — ONDANSETRON HCL 4 MG/2ML IJ SOLN: 4.0000 mg | Freq: Four times a day (QID) | INTRAMUSCULAR | Status: DC | PRN

## 2022-12-18 MED ORDER — NICOTINE 21 MG/24HR TD PT24
21.0000 mg | MEDICATED_PATCH | Freq: Every day | TRANSDERMAL | Status: DC
  Administered 2022-12-18 – 2022-12-19 (×2): 21 mg via TRANSDERMAL
  Filled 2022-12-18 (×3): qty 1

## 2022-12-18 MED ORDER — BUPIVACAINE LIPOSOME 1.3 % IJ SUSP
INTRAMUSCULAR | Status: DC | PRN
  Administered 2022-12-18: 40 mL

## 2022-12-18 MED ORDER — ADULT MULTIVITAMIN W/MINERALS CH
1.0000 | ORAL_TABLET | Freq: Every day | ORAL | Status: DC
  Administered 2022-12-18 – 2022-12-23 (×6): 1 via ORAL
  Filled 2022-12-18 (×6): qty 1

## 2022-12-18 MED ORDER — FENTANYL CITRATE (PF) 100 MCG/2ML IJ SOLN
INTRAMUSCULAR | Status: DC | PRN
  Administered 2022-12-18: 100 ug via INTRAVENOUS

## 2022-12-18 MED ORDER — LORAZEPAM 1 MG PO TABS: 1.0000 mg | ORAL_TABLET | ORAL | Status: AC | PRN

## 2022-12-18 MED ORDER — ROCURONIUM BROMIDE 10 MG/ML (PF) SYRINGE
PREFILLED_SYRINGE | INTRAVENOUS | Status: AC
  Filled 2022-12-18: qty 10

## 2022-12-18 MED ORDER — LORAZEPAM 2 MG PO TABS: 2.0000 mg | ORAL_TABLET | ORAL | Status: AC | PRN

## 2022-12-18 MED ORDER — ACETAMINOPHEN 650 MG RE SUPP: 650.0000 mg | Freq: Four times a day (QID) | RECTAL | Status: DC | PRN

## 2022-12-18 MED ORDER — MAGNESIUM HYDROXIDE 400 MG/5ML PO SUSP
30.0000 mL | Freq: Every day | ORAL | Status: DC | PRN
  Administered 2022-12-20 – 2022-12-23 (×4): 30 mL via ORAL
  Filled 2022-12-18 (×4): qty 30

## 2022-12-18 MED ORDER — MIDAZOLAM HCL 2 MG/2ML IJ SOLN
INTRAMUSCULAR | Status: DC | PRN
  Administered 2022-12-18: 2 mg via INTRAVENOUS

## 2022-12-18 MED ORDER — PHENYLEPHRINE HCL-NACL 20-0.9 MG/250ML-% IV SOLN
INTRAVENOUS | Status: DC | PRN
  Administered 2022-12-18: 50 ug/min via INTRAVENOUS

## 2022-12-18 MED ORDER — SUCCINYLCHOLINE CHLORIDE 200 MG/10ML IV SOSY
PREFILLED_SYRINGE | INTRAVENOUS | Status: DC | PRN
  Administered 2022-12-18: 100 mg via INTRAVENOUS

## 2022-12-18 MED ORDER — EPHEDRINE SULFATE-NACL 50-0.9 MG/10ML-% IV SOSY
PREFILLED_SYRINGE | INTRAVENOUS | Status: DC | PRN
  Administered 2022-12-18: 10 mg via INTRAVENOUS
  Administered 2022-12-18 (×2): 5 mg via INTRAVENOUS

## 2022-12-18 MED ORDER — OXYCODONE HCL 5 MG PO TABS
5.0000 mg | ORAL_TABLET | ORAL | Status: DC | PRN
  Administered 2022-12-18 – 2022-12-23 (×23): 5 mg via ORAL
  Filled 2022-12-18 (×23): qty 1

## 2022-12-18 MED ORDER — HYDROMORPHONE HCL 1 MG/ML IJ SOLN: 0.5000 mg | INTRAMUSCULAR | Status: DC | PRN

## 2022-12-18 MED ORDER — PROPOFOL 10 MG/ML IV BOLUS
INTRAVENOUS | Status: DC | PRN
  Administered 2022-12-18: 130 mg via INTRAVENOUS

## 2022-12-18 MED ORDER — ONDANSETRON HCL 4 MG/2ML IJ SOLN
INTRAMUSCULAR | Status: AC
  Filled 2022-12-18: qty 2

## 2022-12-18 MED ORDER — HYDROMORPHONE HCL 1 MG/ML IJ SOLN
INTRAMUSCULAR | Status: AC
  Filled 2022-12-18: qty 1

## 2022-12-18 MED ORDER — FENTANYL CITRATE (PF) 250 MCG/5ML IJ SOLN
INTRAMUSCULAR | Status: AC
  Filled 2022-12-18: qty 5

## 2022-12-18 MED ORDER — HYDROMORPHONE HCL 1 MG/ML IJ SOLN
INTRAMUSCULAR | Status: DC | PRN
  Administered 2022-12-18: 1 mg via INTRAVENOUS

## 2022-12-18 MED ORDER — HYDROMORPHONE HCL 1 MG/ML IJ SOLN
0.5000 mg | INTRAMUSCULAR | Status: DC | PRN
  Administered 2022-12-20 – 2022-12-22 (×10): 1 mg via INTRAVENOUS
  Filled 2022-12-18 (×11): qty 1

## 2022-12-18 MED ORDER — MORPHINE SULFATE (PF) 4 MG/ML IV SOLN
4.0000 mg | Freq: Once | INTRAVENOUS | Status: AC
  Administered 2022-12-18: 4 mg via INTRAVENOUS
  Filled 2022-12-18: qty 1

## 2022-12-18 MED ORDER — EPHEDRINE 5 MG/ML INJ
INTRAVENOUS | Status: AC
  Filled 2022-12-18: qty 5

## 2022-12-18 MED ORDER — SUGAMMADEX SODIUM 200 MG/2ML IV SOLN
INTRAVENOUS | Status: DC | PRN
  Administered 2022-12-18: 200 mg via INTRAVENOUS

## 2022-12-18 MED ORDER — ACETAMINOPHEN 325 MG PO TABS: 650.0000 mg | ORAL_TABLET | Freq: Four times a day (QID) | ORAL | Status: DC | PRN

## 2022-12-18 MED ORDER — IOHEXOL 300 MG/ML  SOLN
100.0000 mL | Freq: Once | INTRAMUSCULAR | Status: AC | PRN
  Administered 2022-12-18: 100 mL via INTRAVENOUS

## 2022-12-18 MED ORDER — THIAMINE HCL 100 MG/ML IJ SOLN
100.0000 mg | Freq: Every day | INTRAMUSCULAR | Status: DC
  Filled 2022-12-18: qty 2

## 2022-12-18 MED ORDER — DEXAMETHASONE SODIUM PHOSPHATE 10 MG/ML IJ SOLN
INTRAMUSCULAR | Status: DC | PRN
  Administered 2022-12-18: 10 mg via INTRAVENOUS

## 2022-12-18 MED ORDER — OXYCODONE HCL 5 MG PO TABS: 5.0000 mg | ORAL_TABLET | Freq: Once | ORAL | Status: DC | PRN

## 2022-12-18 MED ORDER — HYDROMORPHONE HCL 1 MG/ML IJ SOLN
1.0000 mg | Freq: Once | INTRAMUSCULAR | Status: AC
  Administered 2022-12-18: 1 mg via INTRAVENOUS
  Filled 2022-12-18: qty 1

## 2022-12-18 MED ORDER — FOLIC ACID 1 MG PO TABS
1.0000 mg | ORAL_TABLET | Freq: Every day | ORAL | Status: DC
  Administered 2022-12-18 – 2022-12-23 (×6): 1 mg via ORAL
  Filled 2022-12-18 (×6): qty 1

## 2022-12-18 MED ORDER — LIDOCAINE HCL (CARDIAC) PF 100 MG/5ML IV SOSY
PREFILLED_SYRINGE | INTRAVENOUS | Status: DC | PRN
  Administered 2022-12-18: 100 mg via INTRAVENOUS

## 2022-12-18 MED ORDER — ONDANSETRON HCL 4 MG/2ML IJ SOLN
4.0000 mg | Freq: Once | INTRAMUSCULAR | Status: AC | PRN
  Administered 2022-12-18: 4 mg via INTRAVENOUS

## 2022-12-18 MED ORDER — MORPHINE SULFATE (PF) 2 MG/ML IV SOLN
2.0000 mg | INTRAVENOUS | Status: DC | PRN
  Administered 2022-12-18 – 2022-12-23 (×22): 2 mg via INTRAVENOUS
  Filled 2022-12-18 (×22): qty 1

## 2022-12-18 MED ORDER — FENTANYL CITRATE (PF) 100 MCG/2ML IJ SOLN
INTRAMUSCULAR | Status: AC
  Filled 2022-12-18: qty 2

## 2022-12-18 MED ORDER — 0.9 % SODIUM CHLORIDE (POUR BTL) OPTIME
TOPICAL | Status: DC | PRN
  Administered 2022-12-18: 1500 mL

## 2022-12-18 MED ORDER — OXYCODONE HCL 5 MG/5ML PO SOLN: 5.0000 mg | Freq: Once | ORAL | Status: DC | PRN

## 2022-12-18 MED ORDER — FENTANYL CITRATE (PF) 100 MCG/2ML IJ SOLN
25.0000 ug | INTRAMUSCULAR | Status: DC | PRN
  Administered 2022-12-18: 50 ug via INTRAVENOUS

## 2022-12-18 MED ORDER — ONDANSETRON HCL 4 MG PO TABS: 4.0000 mg | ORAL_TABLET | Freq: Four times a day (QID) | ORAL | Status: DC | PRN

## 2022-12-18 SURGICAL SUPPLY — 19 items
APL PRP STRL LF DISP 70% ISPRP (MISCELLANEOUS) ×1
CHLORAPREP W/TINT 26 (MISCELLANEOUS) ×1 IMPLANT
DRAPE ORTHO SPLIT 77X108 STRL (DRAPES) ×1
DRAPE SHEET LG 3/4 BI-LAMINATE (DRAPES) ×1 IMPLANT
DRAPE SURG 17X11 SM STRL (DRAPES) ×2 IMPLANT
DRAPE SURG ORHT 6 SPLT 77X108 (DRAPES) ×1 IMPLANT
GLOVE BIO SURGEON STRL SZ8 (GLOVE) ×4 IMPLANT
GLOVE BIOGEL PI IND STRL 8 (GLOVE) ×1 IMPLANT
GOWN STRL REUS W/ TWL LRG LVL3 (GOWN DISPOSABLE) ×1 IMPLANT
GOWN STRL REUS W/ TWL XL LVL3 (GOWN DISPOSABLE) ×1 IMPLANT
GOWN STRL REUS W/TWL LRG LVL3 (GOWN DISPOSABLE) ×1
GOWN STRL REUS W/TWL XL LVL3 (GOWN DISPOSABLE) ×1
HOLSTER ELECTROSUGICAL PENCIL (MISCELLANEOUS) ×1 IMPLANT
KIT TURNOVER KIT A (KITS) ×1 IMPLANT
MANIFOLD NEPTUNE II (INSTRUMENTS) ×1 IMPLANT
NS IRRIG 1000ML POUR BTL (IV SOLUTION) ×1 IMPLANT
PAD NEG PRESSURE SENSATRAC (MISCELLANEOUS) IMPLANT
SPONGE T-LAP 18X18 ~~LOC~~+RFID (SPONGE) ×5 IMPLANT
TRAP FLUID SMOKE EVACUATOR (MISCELLANEOUS) ×1 IMPLANT

## 2022-12-18 NOTE — Plan of Care (Signed)

## 2022-12-18 NOTE — Op Note (Signed)
12/18/2022  6:03 AM  Patient:   Erik Barker  Pre-Op Diagnosis:   Acute compartment syndrome, left buttock.  Post-Op Diagnosis:   Acute compartment syndrome of left buttock and anterior left hip abductor muscle.  Procedure:   Fasciotomies of gluteus maximus and gluteus medius and minimus muscles.  Surgeon:   Maryagnes Amos, MD  Assistant:   None  Anesthesia:   GET  Findings:   As above.  Complications:   None  Fluids:   900 cc crystalloid  EBL:   25 cc  UOP:   None  TT:   None  Drains:   Wound VAC x 2  Closure:   None  Brief Clinical Note:   The patient is a 52 year old male who apparently became inebriated the night before and passed out, lying on his left buttock for a prolonged period of time.  He woke around noontime yesterday complaining of increased left buttock pain.  His symptoms worsened throughout the day and he began to experience some paresthesias down his legs who presented to the emergency room around midnight.  Subsequently, he was diagnosed with acute compartment syndrome of his left buttock and presents at this time for urgent fasciotomies.  Procedure:   The patient was brought into the operating room and laid in the supine position.  After adequate general endotracheal intubation and anesthesia were obtained, he was rolled into the right lateral decubitus position and secured using a lateral hip positioner.  The left hip and lower extremity was prepped with a ChloraPrep solution before being draped sterilely.  Preoperative antibiotics were administered.  A timeout was performed to verify the appropriate surgical site.  An approximately 4 to 5 inch incision was made over the lateral aspect of the buttock centered over the area of maximal tension.  The incision was carried down through the subcutaneous tissues to expose the gluteal fascia.  This was split the length of the incision to expose the muscle.  The muscle was then dissected bluntly using finger  dissection to be sure that it was fully freed up.  Most of the muscle was responsive to a light touch of the Bovie, although there was an area centrally that was of questionable viability.  Blunt dissection was extended posteriorly and anteriorly into the gluteus medius and minimus regions.  A second approximately 3 inch incision was made over an area of distended hard swelling over the anterior aspect of the hip just lateral to the anterior superior iliac spine.  This incision was carried down through the subcutaneous tissues to expose the underlying fascia.  The fascia was split, again exposing muscle under tension.  This muscle also was responsive to a light touch of the Bovie, indicating viability.  Again, blunt dissection was carried out anteriorly and posteriorly to be sure that the all fascial planes were released.  Hemostasis was achieved using electrocautery.  A solution of 20 cc of Exparel and 20 cc of 0.5% Sensorcaine with epinephrine was injected into the subcutaneous tissues of both wounds before wound vacs were applied over each wound.  Using a Y connector, the two wound VACs were connected together.  The suction device was turned on and the adequacy of suction to each wound verified.  The patient was then awakened, extubated, and returned to the recovery room in satisfactory condition after tolerating the procedure well.
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
## 2022-12-18 NOTE — Anesthesia Procedure Notes (Signed)
Procedure Name: Intubation Date/Time: 12/18/2022 4:47 AM  Performed by: Karoline Caldwell, CRNAPre-anesthesia Checklist: Patient identified, Patient being monitored, Timeout performed, Emergency Drugs available and Suction available Patient Re-evaluated:Patient Re-evaluated prior to induction Oxygen Delivery Method: Circle system utilized Preoxygenation: Pre-oxygenation with 100% oxygen Induction Type: IV induction Ventilation: Mask ventilation without difficulty Laryngoscope Size: McGraph and 4 Grade View: Grade I Tube type: Oral Tube size: 7.0 mm Number of attempts: 1 Airway Equipment and Method: Stylet Placement Confirmation: ETT inserted through vocal cords under direct vision, positive ETCO2 and breath sounds checked- equal and bilateral Secured at: 21 cm Tube secured with: Tape Dental Injury: Teeth and Oropharynx as per pre-operative assessment

## 2022-12-18 NOTE — Transfer of Care (Addendum)
Immediate Anesthesia Transfer of Care Note  Patient: Erik Barker  Procedure(s) Performed: FASCIECTOMY with wound vac placement (Left)  Patient Location: PACU  Anesthesia Type:General  Level of Consciousness: awake, alert , and oriented  Airway & Oxygen Therapy: Patient Spontanous Breathing  Post-op Assessment: Report given to RN and Post -op Vital signs reviewed and stable  Post vital signs: Reviewed and stable  Last Vitals:  Vitals Value Taken Time  BP 119/79 12/18/22 0602  Temp 97.85f   Pulse 68 12/18/22 0605  Resp 12 12/18/22 0605  SpO2 99 % 12/18/22 0605  Vitals shown include unfiled device data.  Last Pain:  Vitals:   12/18/22 0411  TempSrc:   PainSc: Asleep         Complications: No notable events documented.

## 2022-12-18 NOTE — Anesthesia Preprocedure Evaluation (Signed)
Anesthesia Evaluation  Patient identified by MRN, date of birth, ID band Patient awake    Reviewed: Allergy & Precautions, NPO status , Patient's Chart, lab work & pertinent test results  History of Anesthesia Complications Negative for: history of anesthetic complications  Airway Mallampati: III  TM Distance: >3 FB Neck ROM: Full    Dental  (+) Poor Dentition   Pulmonary neg sleep apnea, neg COPD, Current SmokerPatient did not abstain from smoking.   Pulmonary exam normal breath sounds clear to auscultation       Cardiovascular Exercise Tolerance: Good METS(-) hypertension(-) CAD and (-) Past MI negative cardio ROS (-) dysrhythmias  Rhythm:Regular Rate:Normal - Systolic murmurs    Neuro/Psych negative neurological ROS  negative psych ROS   GI/Hepatic ,neg GERD  ,,(+)     substance abuse  methamphetamine use and IV drug use, Hepatitis -, CLast fentanyl use a few days ago. No meth use "in a long while"   Endo/Other  neg diabetes    Renal/GU negative Renal ROS     Musculoskeletal  (+)  narcotic dependent  Abdominal   Peds  Hematology   Anesthesia Other Findings Past Medical History: No date: Heroin use No date: Methamphetamine use (HCC)  Reproductive/Obstetrics                              Anesthesia Physical Anesthesia Plan  ASA: 3 and emergent  Anesthesia Plan: General   Post-op Pain Management: Ofirmev IV (intra-op)* and Dilaudid IV   Induction: Intravenous and Rapid sequence  PONV Risk Score and Plan: 2 and Ondansetron, Dexamethasone and Midazolam  Airway Management Planned: Oral ETT and Video Laryngoscope Planned  Additional Equipment: None  Intra-op Plan:   Post-operative Plan: Extubation in OR  Informed Consent: I have reviewed the patients History and Physical, chart, labs and discussed the procedure including the risks, benefits and alternatives for the proposed  anesthesia with the patient or authorized representative who has indicated his/her understanding and acceptance.     Dental advisory given  Plan Discussed with: CRNA and Surgeon  Anesthesia Plan Comments: (Had crackers 4.5 hours ago, need to proceed given emergency case. Wil plan for RSI.  Discussed risks of anesthesia with patient, including PONV, aspiration, sore throat, lip/dental/eye damage. Rare risks discussed as well, such as cardiorespiratory and neurological sequelae, and allergic reactions. Discussed the role of CRNA in patient's perioperative care. Patient understands.)         Anesthesia Quick Evaluation

## 2022-12-18 NOTE — Progress Notes (Signed)
Pt brought up by PACU  RN, a/o x4, no complaints of pain, wound vac intact, IVF started. Vitals stable, report given to oncoming RN.

## 2022-12-19 ENCOUNTER — Encounter: Payer: Self-pay | Admitting: Surgery

## 2022-12-19 LAB — URINE DRUG SCREEN, QUALITATIVE (ARMC ONLY)
Amphetamines, Ur Screen: POSITIVE — AB
Barbiturates, Ur Screen: NOT DETECTED
Benzodiazepine, Ur Scrn: POSITIVE — AB
Cannabinoid 50 Ng, Ur ~~LOC~~: POSITIVE — AB
Cocaine Metabolite,Ur ~~LOC~~: POSITIVE — AB
MDMA (Ecstasy)Ur Screen: NOT DETECTED
Methadone Scn, Ur: NOT DETECTED
Opiate, Ur Screen: POSITIVE — AB
Phencyclidine (PCP) Ur S: NOT DETECTED
Tricyclic, Ur Screen: NOT DETECTED

## 2022-12-19 LAB — COMPREHENSIVE METABOLIC PANEL
ALT: 214 U/L — ABNORMAL HIGH (ref 0–44)
AST: 402 U/L — ABNORMAL HIGH (ref 15–41)
Albumin: 2.5 g/dL — ABNORMAL LOW (ref 3.5–5.0)
Alkaline Phosphatase: 69 U/L (ref 38–126)
Anion gap: 6 (ref 5–15)
BUN: 11 mg/dL (ref 6–20)
CO2: 27 mmol/L (ref 22–32)
Calcium: 8 mg/dL — ABNORMAL LOW (ref 8.9–10.3)
Chloride: 104 mmol/L (ref 98–111)
Creatinine, Ser: 0.59 mg/dL — ABNORMAL LOW (ref 0.61–1.24)
GFR, Estimated: >60 mL/min (ref >60)
Glucose, Bld: 149 mg/dL — ABNORMAL HIGH (ref 70–99)
Potassium: 4.1 mmol/L (ref 3.5–5.1)
Sodium: 137 mmol/L (ref 135–145)
Total Bilirubin: 0.4 mg/dL (ref 0.3–1.2)
Total Protein: 5.2 g/dL — ABNORMAL LOW (ref 6.5–8.1)

## 2022-12-19 LAB — CBC
HCT: 38.7 % — ABNORMAL LOW (ref 39.0–52.0)
Hemoglobin: 12.9 g/dL — ABNORMAL LOW (ref 13.0–17.0)
MCH: 29.3 pg (ref 26.0–34.0)
MCHC: 33.3 g/dL (ref 30.0–36.0)
MCV: 87.8 fL (ref 80.0–100.0)
Platelets: 238 10*3/uL (ref 150–400)
RBC: 4.41 MIL/uL (ref 4.22–5.81)
RDW: 15.1 % (ref 11.5–15.5)
WBC: 14.2 10*3/uL — ABNORMAL HIGH (ref 4.0–10.5)
nRBC: 0 % (ref 0.0–0.2)

## 2022-12-19 LAB — CK: Total CK: 11775 U/L — ABNORMAL HIGH (ref 49–397)

## 2022-12-19 NOTE — Plan of Care (Signed)
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
## 2022-12-19 NOTE — Anesthesia Postprocedure Evaluation (Signed)
Anesthesia Post Note  Patient: Erik Barker  Procedure(s) Performed: FASCIECTOMY with wound vac placement (Left)  Patient location during evaluation: PACU Anesthesia Type: General Level of consciousness: awake and alert Pain management: pain level controlled Vital Signs Assessment: post-procedure vital signs reviewed and stable Respiratory status: spontaneous breathing, nonlabored ventilation, respiratory function stable and patient connected to nasal cannula oxygen Cardiovascular status: blood pressure returned to baseline and stable Postop Assessment: no apparent nausea or vomiting Anesthetic complications: no   No notable events documented.   Last Vitals:  Vitals:   12/18/22 1912 12/19/22 0349  BP: 139/87 123/82  Pulse: 75 73  Resp: 16 16  Temp: 36.7 C 36.7 C  SpO2: 100% 98%    Last Pain:  Vitals:   12/19/22 0818  TempSrc:   PainSc: 7                  Corinda Gubler

## 2022-12-20 DIAGNOSIS — Z4889 Encounter for other specified surgical aftercare: Secondary | ICD-10-CM

## 2022-12-20 DIAGNOSIS — T79A22A Traumatic compartment syndrome of left lower extremity, initial encounter: Secondary | ICD-10-CM

## 2022-12-20 LAB — COMPREHENSIVE METABOLIC PANEL
ALT: 186 U/L — ABNORMAL HIGH (ref 0–44)
AST: 333 U/L — ABNORMAL HIGH (ref 15–41)
Albumin: 2.5 g/dL — ABNORMAL LOW (ref 3.5–5.0)
Alkaline Phosphatase: 59 U/L (ref 38–126)
Anion gap: 5 (ref 5–15)
BUN: 10 mg/dL (ref 6–20)
CO2: 28 mmol/L (ref 22–32)
Calcium: 7.9 mg/dL — ABNORMAL LOW (ref 8.9–10.3)
Chloride: 102 mmol/L (ref 98–111)
Creatinine, Ser: 0.58 mg/dL — ABNORMAL LOW (ref 0.61–1.24)
GFR, Estimated: >60 mL/min (ref >60)
Glucose, Bld: 130 mg/dL — ABNORMAL HIGH (ref 70–99)
Potassium: 3.7 mmol/L (ref 3.5–5.1)
Sodium: 135 mmol/L (ref 135–145)
Total Bilirubin: 0.2 mg/dL — ABNORMAL LOW (ref 0.3–1.2)
Total Protein: 5.2 g/dL — ABNORMAL LOW (ref 6.5–8.1)

## 2022-12-20 LAB — CBC
HCT: 40 % (ref 39.0–52.0)
Hemoglobin: 13.2 g/dL (ref 13.0–17.0)
MCH: 29.2 pg (ref 26.0–34.0)
MCHC: 33 g/dL (ref 30.0–36.0)
MCV: 88.5 fL (ref 80.0–100.0)
Platelets: 219 10*3/uL (ref 150–400)
RBC: 4.52 MIL/uL (ref 4.22–5.81)
RDW: 15.3 % (ref 11.5–15.5)
WBC: 7.2 10*3/uL (ref 4.0–10.5)
nRBC: 0 % (ref 0.0–0.2)

## 2022-12-20 LAB — CK: Total CK: 5035 U/L — ABNORMAL HIGH (ref 49–397)

## 2022-12-20 MED ORDER — CEFAZOLIN SODIUM-DEXTROSE 2-4 GM/100ML-% IV SOLN
2.0000 g | INTRAVENOUS | Status: AC
  Administered 2022-12-21: 2 g via INTRAVENOUS
  Filled 2022-12-20: qty 100

## 2022-12-20 MED ORDER — CHLORHEXIDINE GLUCONATE 4 % EX SOLN
60.0000 mL | Freq: Once | CUTANEOUS | Status: AC
  Administered 2022-12-21: 4 via TOPICAL

## 2022-12-20 MED ORDER — DIPHENHYDRAMINE HCL 50 MG/ML IJ SOLN: 50.0000 mg | Freq: Once | INTRAMUSCULAR | Status: DC | PRN

## 2022-12-20 MED ORDER — FAMOTIDINE 20 MG PO TABS: 40.0000 mg | ORAL_TABLET | Freq: Once | ORAL | Status: DC | PRN

## 2022-12-20 MED ORDER — METHYLPREDNISOLONE SODIUM SUCC 125 MG IJ SOLR: 125.0000 mg | Freq: Once | INTRAMUSCULAR | Status: DC | PRN

## 2022-12-20 MED ORDER — MIDAZOLAM HCL 2 MG/ML PO SYRP: 8.0000 mg | ORAL_SOLUTION | Freq: Once | ORAL | Status: DC | PRN

## 2022-12-20 MED ORDER — SODIUM CHLORIDE 0.9 % IV SOLN: INTRAVENOUS | Status: DC
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
## 2022-12-20 NOTE — TOC Initial Note (Signed)
Transition of Care [facility]) - Initial/Assessment Note    Patient Details  Name: Erik Barker MRN: [id number] Date of Birth: 1971/01/15  Transition of Care [facility]) CM/SW Contact:    Marquita Palms, LCSW Phone Number: 12/20/2022, 2:31 PM  Clinical Narrative:                  CSW met with pt bedside. SA resources and [facility] information given. He reported family will transport at discharge. Meds to be sent to medication management upon DC  Expected Discharge Plan: Home/Self Care Barriers to Discharge: Continued Medical Work up   Patient Goals and CMS Choice            Expected Discharge Plan and Services                                              Prior Living Arrangements/Services       Do you feel safe going back to the place where you live?: Yes               Activities of Daily Living Home Assistive Devices/Equipment: None ADL Screening (condition at time of admission) Patient's cognitive ability adequate to safely complete daily activities?: Yes Is the patient deaf or have difficulty hearing?: No Does the patient have difficulty seeing, even when wearing glasses/contacts?: No Does the patient have difficulty concentrating, remembering, or making decisions?: No Patient able to express need for assistance with ADLs?: Yes Does the patient have difficulty dressing or bathing?: No Independently performs ADLs?: Yes (appropriate for developmental age) Does the patient have difficulty walking or climbing stairs?: No Weakness of Legs: None Weakness of Arms/Hands: None  Permission Sought/Granted                  Emotional Assessment Appearance:: Disheveled Attitude/Demeanor/Rapport: Gracious Affect (typically observed): Calm Orientation: : Oriented to Self, Oriented to Place, Oriented to  Time, Oriented to Situation      Admission diagnosis:  Transaminitis [R74.01] Left leg pain [M79.605] Compartment syndrome of left lower  extremity, initial encounter (HCC) [T79.A22A] Compartment syndrome of buttock (HCC) [T79.A29A] Compartment syndrome (HCC) [T79.A0XA] Patient Active Problem List   Diagnosis Date Noted   Encounter for postoperative wound care 12/20/2022   Compartment syndrome of buttock (HCC) 12/18/2022   Compartment syndrome of left lower extremity (HCC) 12/18/2022   Abscess of right foot 05/30/2022   Septic arthritis (HCC) 05/27/2022   Osteomyelitis of right foot (HCC) 04/03/2022   Hyponatremia 04/02/2022   Hepatitis C 04/01/2022   MSSA bacteremia 03/31/2022   Osteomyelitis of ankle or foot, acute, right (HCC) 03/30/2022   Septic arthritis of foot (HCC) 03/30/2022   Polysubstance abuse (HCC) 03/30/2022   Cellulitis 03/10/2022   Heroin use 03/10/2022   Tobacco abuse 03/10/2022   Elevated liver function tests 03/10/2022   Tobacco abuse counseling 03/10/2022   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 [place] [address] Phone: [phone] Fax: [phone]     Social Determinants of Health (SDOH) Social History: SDOH Screenings   Food Insecurity: No Food Insecurity (  12/18/2022)  Housing: Low Risk  (12/18/2022)  Transportation Needs: No Transportation Needs (12/18/2022)  Utilities: Not At Risk (12/18/2022)  Tobacco Use: High Risk (12/18/2022)   SDOH Interventions:     Readmission Risk Interventions     No data to display

## 2022-12-20 NOTE — Progress Notes (Signed)
PROGRESS NOTE   HPI was taken from Dr. Irving Burton: Erik Barker is a 52 y.o. male with medical history significant of polysubstance abuse, alcohol abuse, presented with worsening of left leg pain and numbness.   Patient admits that on Wednesday night he used heroin and alcohol and "Might have passed out", he woke up next morning on the floor and started to feel " could not move left leg" which he thought was pressure palsy.  Later he was able to stand up on his own and walk again but continue to feel pain on the backside of the left thigh and numbness of left foot.  Symptoms persisted and yesterday patient decided come to ED.   In the ED, blood work showed CK 45,000, K4.1, creatinine 0.6, AST 1000, ALT 307, CT abdominal pelvis showed left gluteal compartment syndrome.  Patient was moved to the OR and emergency fasciotomy was done.   Erik Barker  ZOX:[id number] DOB: 29-Dec-1970 DOA: 12/17/2022 PCP: Pcp, No   Assessment & Plan:   Principal Problem:   Compartment syndrome of buttock (HCC) Active Problems:   Alcohol abuse   Compartment syndrome (HCC)  Assessment and Plan: Left gluteal compartment syndrome: s/p fasciotomies of gluteus maximus, medius & minimus muslces as per ortho surg. Continue w/ wound vac as per ortho surg. Will go for procedure tomorrow as per vasc surg. Vasc surg consulted by ortho surg to manage fasciotomy care w/ continue wound vac application or closure. NPO after midnight. Oxycodone, dilaudid prn pain.     Rhabdomyolysis: nontraumatic. Still significantly elevated but trending down daily. Continue on IVFs  Acute transaminitis: likely secondary to rhabdomyolysis. RUQ US shows fatty liver. Still elevated but trending down    Polysubstance abuse: urine drug is positive for amphetamines, benzos,cocaine, marijuana,opiates. Admits to using heroin prior to admission. Received cessation counseling already   Leukocytosis: resolved     DVT prophylaxis: lovenox Code  Status: full  Family Communication:  Disposition Plan:depends on OT/PT recs (not consulted yet)  Level of care: Med-Surg Status is: Inpatient Remains inpatient appropriate because: severity of illness    Consultants:  Ortho surg  Vasc surg   Procedures:   Antimicrobials:   Subjective: Pt c/o buttocks pain   Objective: Vitals:   12/19/22 0826 12/19/22 1642 12/19/22 1942 12/20/22 0300  BP: 118/74 124/77 116/78 113/78  Pulse: 71 74 72 66  Resp: 16 18 16 16   Temp: 98.2 F (36.8 C) 98.6 F (37 C) 98.4 F (36.9 C) 97.9 F (36.6 C)  TempSrc:  Oral    SpO2: 100% 100% 100% 100%  Weight:      Height:        Intake/Output Summary (Last 24 hours) at 12/20/2022 0848 Last data filed at 12/20/2022 0747 Gross per 24 hour  Intake 1768.82 ml  Output 1400 ml  Net 368.82 ml   Filed Weights   12/17/22 2345  Weight: 82.6 kg    Examination:  General exam: Appears comfortable  Respiratory system: clear breath sounds b/l  Cardiovascular system: S1/S2+. No rubs or clicks  Gastrointestinal system: Abd is soft, NT, ND & hypoactive bowel sounds  Central nervous system: alert & oriented. Moves all extremities  Psychiatry: Judgement and insight appears at baseline. Appropriate mood and affect     Data Reviewed: I have personally reviewed following labs and imaging studies  CBC: Recent Labs  Lab 12/18/22 0044 12/19/22 0553 12/20/22 0735  WBC 12.0* 14.2* 7.2  NEUTROABS 10.3*  --   --  HGB 14.0 12.9* 13.2  HCT 42.7 38.7* 40.0  MCV 88.4 87.8 88.5  PLT 233 238 219   Basic Metabolic Panel: Recent Labs  Lab 12/18/22 0107 12/19/22 0553  NA 134* 137  K 4.1 4.1  CL 102 104  CO2 24 27  GLUCOSE 116* 149*  BUN 19 11  CREATININE 0.65 0.59*  CALCIUM 8.1* 8.0*   GFR: Estimated Creatinine Clearance: 113.7 mL/min (A) (by C-G formula based on SCr of 0.59 mg/dL (L)). Liver Function Tests: Recent Labs  Lab 12/18/22 0107 12/19/22 0553  AST 1,010* 402*  ALT 307* 214*   ALKPHOS 83 69  BILITOT 0.5 0.4  PROT 6.4* 5.2*  ALBUMIN 3.1* 2.5*   No results for input(s): "LIPASE", "AMYLASE" in the last 168 hours. No results for input(s): "AMMONIA" in the last 168 hours. Coagulation Profile: Recent Labs  Lab 12/18/22 0107  INR 1.1   Cardiac Enzymes: Recent Labs  Lab 12/18/22 0106 12/19/22 0553  CKTOTAL 45,514* 11,775*   BNP (last 3 results) No results for input(s): "PROBNP" in the last 8760 hours. HbA1C: No results for input(s): "HGBA1C" in the last 72 hours. CBG: No results for input(s): "GLUCAP" in the last 168 hours. Lipid Profile: No results for input(s): "CHOL", "HDL", "LDLCALC", "TRIG", "CHOLHDL", "LDLDIRECT" in the last 72 hours. Thyroid Function Tests: No results for input(s): "TSH", "T4TOTAL", "FREET4", "T3FREE", "THYROIDAB" in the last 72 hours. Anemia Panel: No results for input(s): "VITAMINB12", "FOLATE", "FERRITIN", "TIBC", "IRON", "RETICCTPCT" in the last 72 hours. Sepsis Labs: No results for input(s): "PROCALCITON", "LATICACIDVEN" in the last 168 hours.  No results found for this or any previous visit (from the past 240 hour(s)).       Radiology Studies: No results found.      Scheduled Meds:  enoxaparin (LOVENOX) injection  40 mg Subcutaneous Q24H   folic acid  1 mg Oral Daily   multivitamin with minerals  1 tablet Oral Daily   nicotine  21 mg Transdermal Daily   thiamine  100 mg Oral Daily   Or   thiamine  100 mg Intravenous Daily   Continuous Infusions:  sodium chloride Stopped (12/19/22 1509)     LOS: 2 days       Charise Killian, MD [facility] Pager [phone]  If 7PM-7AM, please contact night-coverage [email or web address] 12/20/2022, 8:48 AM

## 2022-12-20 NOTE — Plan of Care (Signed)
  Problem: Education: Goal: Knowledge of General Education information will improve Description: Including pain rating scale, medication(s)/side effects and non-pharmacologic comfort measures 12/20/2022 1824 by Murriel Hopper, Donald Pore, RN Outcome: Progressing 12/20/2022 1740 by Murriel Hopper, Donald Pore, RN Outcome: Not Progressing   Problem: Health Behavior/Discharge Planning: Goal: Ability to manage health-related needs will improve 12/20/2022 1824 by Monica Becton, RN Outcome: Progressing 12/20/2022 1740 by Murriel Hopper, Donald Pore, RN Outcome: Not Progressing   Problem: Clinical Measurements: Goal: Ability to maintain clinical measurements within normal limits will improve 12/20/2022 1824 by Monica Becton, RN Outcome: Progressing 12/20/2022 1740 by Murriel Hopper, Donald Pore, RN Outcome: Not Progressing Goal: Will remain free from infection 12/20/2022 1824 by Monica Becton, RN Outcome: Progressing 12/20/2022 1740 by Monica Becton, RN Outcome: Not Progressing Goal: Diagnostic test results will improve 12/20/2022 1824 by Monica Becton, RN Outcome: Progressing 12/20/2022 1740 by Monica Becton, RN Outcome: Not Progressing Goal: Respiratory complications will improve 12/20/2022 1824 by Monica Becton, RN Outcome: Progressing 12/20/2022 1740 by Monica Becton, RN Outcome: Not Progressing Goal: Cardiovascular complication will be avoided 12/20/2022 1824 by Monica Becton, RN Outcome: Progressing 12/20/2022 1740 by Monica Becton, RN Outcome: Not Progressing   Problem: Activity: Goal: Risk for activity intolerance will decrease 12/20/2022 1824 by Murriel Hopper, Donald Pore, RN Outcome: Progressing 12/20/2022 1740 by Murriel Hopper, Donald Pore, RN Outcome: Not Progressing   Problem: Nutrition: Goal: Adequate nutrition will be maintained 12/20/2022 1824 by Monica Becton, RN Outcome:  Progressing 12/20/2022 1740 by Monica Becton, RN Outcome: Not Progressing

## 2022-12-20 NOTE — Progress Notes (Signed)
Patient ID: Erik Barker, male   DOB: [birth year], 52 y.o.   MRN: [id number]  Subjective: The patient notes some increased pain and swelling in the left buttock and thigh today as compared to yesterday.  Otherwise, he has no new complaints.   Objective: Vital signs in last 24 hours: Temp:  [97.9 F (36.6 C)-98.6 F (37 C)] 98.2 F (36.8 C) (09/10 0902) Pulse Rate:  [66-74] 70 (09/10 0902) Resp:  [16-18] 16 (09/10 0902) BP: (113-124)/(77-81) 118/81 (09/10 0902) SpO2:  [100 %] 100 % (09/10 0902)  Intake/Output from previous day: 09/09 0701 - 09/10 0700 In: 1768.8 [P.O.:720; I.V.:1048.8] Out: 1750 [Urine:1750] Intake/Output this shift: Total I/O In: -  Out: 150 [Drains:150]  Recent Labs    12/18/22 0044 12/19/22 0553 12/20/22 0735  HGB 14.0 12.9* 13.2   Recent Labs    12/19/22 0553 12/20/22 0735  WBC 14.2* 7.2  RBC 4.41 4.52  HCT 38.7* 40.0  PLT 238 219   Recent Labs    12/19/22 0553 12/20/22 0735  NA 137 135  K 4.1 3.7  CL 104 102  CO2 27 28  BUN 11 10  CREATININE 0.59* 0.58*  GLUCOSE 149* 130*  CALCIUM 8.0* 7.9*   Recent Labs    12/18/22 0107  INR 1.1    Physical Exam: Orthopedic examination again is limited to the left hip and lower extremity.  The wounds appear to be without evidence for infection as there is no surrounding erythema.  The thigh and buttock compartments appear to be soft.  He is grossly neurovascular intact to the left lower extremity and foot.  Assessment: Status post decompressive fasciotomies for acute compartment syndrome of left gluteus musculature.  Plan: The treatment options have been reviewed with the patient.  At this point, the Vascular service will take over his care to include bringing the patient back tomorrow for repeat irrigation debridement of the wounds and probable delayed primary closure with possible skin grafting.  The patient will follow-up with me on an as necessary basis.  Thank you for asking me to  participate in the care of this most unfortunate man.  I will sign off at this time.  If you have further need of orthopedic consultation during this hospitalization, please reconsult me.   Erik Barker 12/20/2022, 1:30 PM

## 2022-12-20 NOTE — Plan of Care (Signed)
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
## 2022-12-20 NOTE — Progress Notes (Signed)
Wound VAC cannister has been changed this morning. 150 ml output documented.Output is still bloody. Cannister left in the room at this time until provider rounds on the patient.

## 2022-12-21 ENCOUNTER — Encounter
Admission: EM | Disposition: A | Discharge status: Home / Self Care | Payer: Self-pay | Source: Home / Self Care | Attending: Internal Medicine

## 2022-12-21 ENCOUNTER — Inpatient Hospital Stay: Payer: Self-pay | Admitting: Anesthesiology

## 2022-12-21 ENCOUNTER — Other Ambulatory Visit: Payer: Self-pay

## 2022-12-21 DIAGNOSIS — Z9889 Other specified postprocedural states: Secondary | ICD-10-CM

## 2022-12-21 DIAGNOSIS — Z481 Encounter for planned postprocedural wound closure: Secondary | ICD-10-CM

## 2022-12-21 DIAGNOSIS — M79A22 Nontraumatic compartment syndrome of left lower extremity: Secondary | ICD-10-CM

## 2022-12-21 HISTORY — PX: APPLICATION OF WOUND VAC: SHX5189

## 2022-12-21 LAB — COMPREHENSIVE METABOLIC PANEL
ALT: 156 U/L — ABNORMAL HIGH (ref 0–44)
AST: 229 U/L — ABNORMAL HIGH (ref 15–41)
Albumin: 2.4 g/dL — ABNORMAL LOW (ref 3.5–5.0)
Alkaline Phosphatase: 66 U/L (ref 38–126)
Anion gap: 4 — ABNORMAL LOW (ref 5–15)
BUN: 12 mg/dL (ref 6–20)
CO2: 28 mmol/L (ref 22–32)
Calcium: 8.1 mg/dL — ABNORMAL LOW (ref 8.9–10.3)
Chloride: 104 mmol/L (ref 98–111)
Creatinine, Ser: 0.62 mg/dL (ref 0.61–1.24)
GFR, Estimated: >60 mL/min (ref >60)
Glucose, Bld: 104 mg/dL — ABNORMAL HIGH (ref 70–99)
Potassium: 4.1 mmol/L (ref 3.5–5.1)
Sodium: 136 mmol/L (ref 135–145)
Total Bilirubin: 0.5 mg/dL (ref 0.3–1.2)
Total Protein: 5.5 g/dL — ABNORMAL LOW (ref 6.5–8.1)

## 2022-12-21 LAB — CBC
HCT: 41.6 % (ref 39.0–52.0)
Hemoglobin: 13.5 g/dL (ref 13.0–17.0)
MCH: 28.9 pg (ref 26.0–34.0)
MCHC: 32.5 g/dL (ref 30.0–36.0)
MCV: 89.1 fL (ref 80.0–100.0)
Platelets: 233 10*3/uL (ref 150–400)
RBC: 4.67 MIL/uL (ref 4.22–5.81)
RDW: 15 % (ref 11.5–15.5)
WBC: 6.8 10*3/uL (ref 4.0–10.5)
nRBC: 0 % (ref 0.0–0.2)

## 2022-12-21 LAB — CK: Total CK: 2577 U/L — ABNORMAL HIGH (ref 49–397)

## 2022-12-21 LAB — GLUCOSE, CAPILLARY: Glucose-Capillary: 99 mg/dL (ref 70–99)

## 2022-12-21 LAB — ABO/RH: ABO/RH(D): O POS

## 2022-12-21 SURGERY — APPLICATION, WOUND VAC
Anesthesia: General | Laterality: Left

## 2022-12-21 MED ORDER — FENTANYL CITRATE PF 50 MCG/ML IJ SOSY: 12.5000 ug | PREFILLED_SYRINGE | Freq: Once | INTRAMUSCULAR | Status: DC | PRN

## 2022-12-21 MED ORDER — FENTANYL CITRATE (PF) 100 MCG/2ML IJ SOLN
INTRAMUSCULAR | Status: DC | PRN
  Administered 2022-12-21: 50 ug via INTRAVENOUS
  Administered 2022-12-21 (×2): 25 ug via INTRAVENOUS

## 2022-12-21 MED ORDER — FENTANYL CITRATE (PF) 100 MCG/2ML IJ SOLN
INTRAMUSCULAR | Status: AC
  Filled 2022-12-21: qty 2

## 2022-12-21 MED ORDER — NICOTINE 21 MG/24HR TD PT24
21.0000 mg | MEDICATED_PATCH | Freq: Every day | TRANSDERMAL | Status: DC
  Administered 2022-12-21 – 2022-12-22 (×2): 21 mg via TRANSDERMAL
  Filled 2022-12-21 (×3): qty 1

## 2022-12-21 MED ORDER — EPINEPHRINE PF 1 MG/ML IJ SOLN
INTRAMUSCULAR | Status: AC
  Filled 2022-12-21: qty 1

## 2022-12-21 MED ORDER — HYDROMORPHONE HCL 1 MG/ML IJ SOLN
1.0000 mg | Freq: Once | INTRAMUSCULAR | Status: AC | PRN
  Administered 2022-12-21: 1 mg via INTRAVENOUS

## 2022-12-21 MED ORDER — PROPOFOL 500 MG/50ML IV EMUL
INTRAVENOUS | Status: DC | PRN
  Administered 2022-12-21: 120 ug/kg/min via INTRAVENOUS

## 2022-12-21 MED ORDER — DEXMEDETOMIDINE HCL IN NACL 80 MCG/20ML IV SOLN
INTRAVENOUS | Status: AC
  Filled 2022-12-21: qty 20

## 2022-12-21 MED ORDER — DEXMEDETOMIDINE HCL IN NACL 80 MCG/20ML IV SOLN
INTRAVENOUS | Status: DC | PRN
  Administered 2022-12-21: 8 ug via INTRAVENOUS

## 2022-12-21 MED ORDER — MIDAZOLAM HCL 2 MG/2ML IJ SOLN
INTRAMUSCULAR | Status: AC
  Filled 2022-12-21: qty 2

## 2022-12-21 MED ORDER — ONDANSETRON HCL 4 MG/2ML IJ SOLN: 4.0000 mg | Freq: Four times a day (QID) | INTRAMUSCULAR | Status: DC | PRN

## 2022-12-21 MED ORDER — OXYCODONE HCL 5 MG/5ML PO SOLN: 5.0000 mg | Freq: Once | ORAL | Status: DC | PRN

## 2022-12-21 MED ORDER — LIDOCAINE HCL (PF) 2 % IJ SOLN
INTRAMUSCULAR | Status: AC
  Filled 2022-12-21: qty 5

## 2022-12-21 MED ORDER — BUPIVACAINE HCL (PF) 0.5 % IJ SOLN
INTRAMUSCULAR | Status: AC
  Filled 2022-12-21: qty 30

## 2022-12-21 MED ORDER — OXYCODONE HCL 5 MG PO TABS: 5.0000 mg | ORAL_TABLET | Freq: Once | ORAL | Status: DC | PRN

## 2022-12-21 MED ORDER — BUPIVACAINE-EPINEPHRINE (PF) 0.5% -1:200000 IJ SOLN
INTRAMUSCULAR | Status: DC | PRN
  Administered 2022-12-21: 30 mL via INTRAMUSCULAR

## 2022-12-21 MED ORDER — MIDAZOLAM HCL 2 MG/2ML IJ SOLN
INTRAMUSCULAR | Status: DC | PRN
  Administered 2022-12-21: 2 mg via INTRAVENOUS

## 2022-12-21 MED ORDER — PROPOFOL 1000 MG/100ML IV EMUL
INTRAVENOUS | Status: AC
  Filled 2022-12-21: qty 100

## 2022-12-21 MED ORDER — FENTANYL CITRATE (PF) 100 MCG/2ML IJ SOLN: 25.0000 ug | INTRAMUSCULAR | Status: DC | PRN

## 2022-12-21 MED ORDER — POLYETHYLENE GLYCOL 3350 17 G PO PACK
17.0000 g | PACK | Freq: Every day | ORAL | Status: DC
  Administered 2022-12-21 – 2022-12-23 (×3): 17 g via ORAL
  Filled 2022-12-21 (×3): qty 1

## 2022-12-21 SURGICAL SUPPLY — 25 items
CANISTER WOUND CARE 500ML ATS (WOUND CARE) ×1 IMPLANT
DRAPE INCISE IOBAN 66X45 STRL (DRAPES) ×1 IMPLANT
DRAPE LAPAROTOMY 100X77 ABD (DRAPES) ×1 IMPLANT
DRESSING PEEL AND PLC PRVNA 13 (GAUZE/BANDAGES/DRESSINGS) IMPLANT
DRSG PEEL AND PLACE PREVENA 13 (GAUZE/BANDAGES/DRESSINGS) ×2
DRSG VAC GRANUFOAM LG (GAUZE/BANDAGES/DRESSINGS) ×1 IMPLANT
DRSG VAC GRANUFOAM MED (GAUZE/BANDAGES/DRESSINGS) ×1 IMPLANT
ELECT REM PT RETURN 9FT ADLT (ELECTROSURGICAL) ×1
ELECTRODE REM PT RTRN 9FT ADLT (ELECTROSURGICAL) ×1 IMPLANT
GAUZE 4X4 16PLY ~~LOC~~+RFID DBL (SPONGE) ×1 IMPLANT
GLOVE BIO SURGEON STRL SZ7 (GLOVE) ×2 IMPLANT
GOWN STRL REUS W/ TWL LRG LVL3 (GOWN DISPOSABLE) ×2 IMPLANT
GOWN STRL REUS W/ TWL XL LVL3 (GOWN DISPOSABLE) ×1 IMPLANT
GOWN STRL REUS W/TWL LRG LVL3 (GOWN DISPOSABLE) ×1
GOWN STRL REUS W/TWL XL LVL3 (GOWN DISPOSABLE) ×1
KIT TURNOVER KIT A (KITS) ×1 IMPLANT
MANIFOLD NEPTUNE II (INSTRUMENTS) ×1 IMPLANT
NS IRRIG 1000ML POUR BTL (IV SOLUTION) ×1 IMPLANT
PACK BASIN MINOR ARMC (MISCELLANEOUS) ×1 IMPLANT
SOL PREP PVP 2OZ (MISCELLANEOUS) ×1
SOLUTION PREP PVP 2OZ (MISCELLANEOUS) ×1 IMPLANT
SPONGE T-LAP 18X18 ~~LOC~~+RFID (SPONGE) ×1 IMPLANT
SUT ETHILON 2 0 FS 18 (SUTURE) IMPLANT
TRAP FLUID SMOKE EVACUATOR (MISCELLANEOUS) ×1 IMPLANT
WATER STERILE IRR 500ML POUR (IV SOLUTION) ×1 IMPLANT

## 2022-12-21 NOTE — Plan of Care (Signed)

## 2022-12-21 NOTE — Progress Notes (Signed)
Progress Note    Erik Barker  WUJ:[id number] DOB: 1970-05-02  DOA: 12/17/2022 PCP: Pcp, No      Brief Narrative:    Medical records reviewed and are as summarized below:  ABEN MANSOOR is a 52 y.o. male with medical history significant for polysubstance use disorder including alcohol use disorder, opioid use disorder, who presented to the hospital with worsening left leg pain and numbness.  He said he had used heroin and alcohol on Wednesday, 12/14/2022 and thinks he might have passed out.  He woke up the following morning and noticed weakness in the left leg.  He developed pressure on the left thigh and left buttock associated with numbness in the left foot.  Symptoms persisted so he presented to the emergency department for further management.      Assessment/Plan:   Principal Problem:   Compartment syndrome of buttock (HCC) Active Problems:   Alcohol abuse   Compartment syndrome of left lower extremity (HCC)   Encounter for postoperative wound care    Body mass index is 24.83 kg/m.   Left buttock and anterior left hip compartment syndrome: S/p fasciotomies of gluteus maximus and gluteus medius and minimus muscles on 12/18/2022. S/p delayed primary closure of 2 left hip fasciotomy wounds, incisional VAC dressings placed on each wound on 12/21/2022. Analgesics as needed for pain.   Rhabdomyolysis: CK improved from [phone].  Discontinue IV fluids.   Elevated liver enzymes, fatty liver: Liver enzymes are trending down.  He was significantly elevated likely from rhabdomyolysis.   Polysubstance use disorder (opioids, benzodiazepines, cocaine, marijuana): Counseled to quit using illicit drugs.   Leukocytosis: Resolved   Diet Order             Diet regular Room service appropriate? Yes; Fluid consistency: Thin  Diet effective now                            Consultants: Orthopedic surgeon Vascular surgeon  Procedures: Left buttock  and anterior left hip fasciotomies   Medications:    enoxaparin (LOVENOX) injection  40 mg Subcutaneous Q24H   folic acid  1 mg Oral Daily   multivitamin with minerals  1 tablet Oral Daily   nicotine  21 mg Transdermal Daily   thiamine  100 mg Oral Daily   Or   thiamine  100 mg Intravenous Daily   Continuous Infusions:  sodium chloride 100 mL/hr at 12/21/22 0723     Anti-infectives (From admission, onward)    Start     Dose/Rate Route Frequency Ordered Stop   12/20/22 2210  ceFAZolin (ANCEF) IVPB 2g/100 mL premix        2 g 200 mL/hr over 30 Minutes Intravenous 30 min pre-op 12/20/22 2210 12/21/22 0752              Family Communication/Anticipated D/C date and plan/Code Status   DVT prophylaxis: enoxaparin (LOVENOX) injection 40 mg Start: 12/18/22 2200 SCDs Start: 12/18/22 0816     Code Status: Full Code  Family Communication: None Disposition Plan: Plan to discharge home   Status is: Inpatient Remains inpatient appropriate because: Left buttock compartment syndrome       Subjective:   Interval events noted.  He complains of pain in the left buttock and left hip.     Objective:    Vitals:   12/21/22 0835 12/21/22 0845 12/21/22 0858 12/21/22 0912  BP: 106/68 101/61 98/60 106/65  Pulse:  79 74 67 70  Resp: 14 (!) 8 (!) 9 18  Temp:   98.1 F (36.7 C) 97.8 F (36.6 C)  TempSrc:    Oral  SpO2: 100% 99% 99% 99%  Weight:      Height:       No data found.   Intake/Output Summary (Last 24 hours) at 12/21/2022 0922 Last data filed at 12/21/2022 0833 Gross per 24 hour  Intake 740 ml  Output 1400 ml  Net -660 ml   Filed Weights   12/17/22 2345 12/21/22 0651  Weight: 82.6 kg 80.7 kg    Exam:  GEN: NAD SKIN: Warm and dry.  Wound VAC on the left hip and left buttock surgical wounds EYES: No pallor or icterus ENT: MMM CV: RRR PULM: CTA B ABD: soft, ND, NT, +BS CNS: AAO x 3, non focal EXT: Tenderness about the left buttock and left  hip    Data Reviewed:   I have personally reviewed following labs and imaging studies:  Labs: Labs show the following:   Basic Metabolic Panel: Recent Labs  Lab 12/18/22 0107 12/19/22 0553 12/20/22 0735 12/21/22 0501  NA 134* 137 135 136  K 4.1 4.1 3.7 4.1  CL 102 104 102 104  CO2 24 27 28 28   GLUCOSE 116* 149* 130* 104*  BUN 19 11 10 12   CREATININE 0.65 0.59* 0.58* 0.62  CALCIUM 8.1* 8.0* 7.9* 8.1*   GFR Estimated Creatinine Clearance: 113.7 mL/min (by C-G formula based on SCr of 0.62 mg/dL). Liver Function Tests: Recent Labs  Lab 12/18/22 0107 12/19/22 0553 12/20/22 0735 12/21/22 0501  AST 1,010* 402* 333* 229*  ALT 307* 214* 186* 156*  ALKPHOS 83 69 59 66  BILITOT 0.5 0.4 0.2* 0.5  PROT 6.4* 5.2* 5.2* 5.5*  ALBUMIN 3.1* 2.5* 2.5* 2.4*   No results for input(s): "LIPASE", "AMYLASE" in the last 168 hours. No results for input(s): "AMMONIA" in the last 168 hours. Coagulation profile Recent Labs  Lab 12/18/22 0107  INR 1.1    CBC: Recent Labs  Lab 12/18/22 0044 12/19/22 0553 12/20/22 0735 12/21/22 0501  WBC 12.0* 14.2* 7.2 6.8  NEUTROABS 10.3*  --   --   --   HGB 14.0 12.9* 13.2 13.5  HCT 42.7 38.7* 40.0 41.6  MCV 88.4 87.8 88.5 89.1  PLT 233 238 219 233   Cardiac Enzymes: Recent Labs  Lab 12/18/22 0106 12/19/22 0553 12/20/22 0735 12/21/22 0501  CKTOTAL 45,514* 11,775* 5,035* 2,577*   BNP (last 3 results) No results for input(s): "PROBNP" in the last 8760 hours. CBG: Recent Labs  Lab 12/21/22 0912  GLUCAP 99   D-Dimer: No results for input(s): "DDIMER" in the last 72 hours. Hgb A1c: No results for input(s): "HGBA1C" in the last 72 hours. Lipid Profile: No results for input(s): "CHOL", "HDL", "LDLCALC", "TRIG", "CHOLHDL", "LDLDIRECT" in the last 72 hours. Thyroid function studies: No results for input(s): "TSH", "T4TOTAL", "T3FREE", "THYROIDAB" in the last 72 hours.  Invalid input(s): "FREET3" Anemia work up: No results for  input(s): "VITAMINB12", "FOLATE", "FERRITIN", "TIBC", "IRON", "RETICCTPCT" in the last 72 hours. Sepsis Labs: Recent Labs  Lab 12/18/22 0044 12/19/22 0553 12/20/22 0735 12/21/22 0501  WBC 12.0* 14.2* 7.2 6.8    Microbiology No results found for this or any previous visit (from the past 240 hour(s)).  Procedures and diagnostic studies:  No results found.             LOS: 3 days   Seve Monette  [employer] Chartered loss adjuster on [email or web address]. If 7PM-7AM, please contact night-coverage at [email or web address]     12/21/2022, 9:22 AM
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
## 2022-12-21 NOTE — Interval H&P Note (Signed)
History and Physical Interval Note:  12/21/2022 7:13 AM  Erik Barker  has presented today for surgery, with the diagnosis of n/a.  The various methods of treatment have been discussed with the patient and family. After consideration of risks, benefits and other options for treatment, the patient has consented to  Procedure(s): APPLICATION OF WOUND VAC vs WOUND CLOSURE HIP LEG (Left) as a surgical intervention.  The patient's history has been reviewed, patient examined, no change in status, stable for surgery.  I have reviewed the patient's chart and labs.  Questions were answered to the patient's satisfaction.     Festus Barren

## 2022-12-21 NOTE — Op Note (Signed)
[facility]   OPERATIVE NOTE  DATE: 12/21/2022  PRE-OPERATIVE DIAGNOSIS: Left hip compartment syndrome status post fasciotomies  POST-OPERATIVE DIAGNOSIS: same as above  PROCEDURE: 1.   Delayed primary closure of two left hip fasciotomy wounds, incisional (disposable) VAC dressings placed on each wound  SURGEON: Festus Barren, MD  ASSISTANT(S): Rolla Plate, NP  ANESTHESIA: MAC  ESTIMATED BLOOD LOSS: 5 cc  FINDING(S): 1.  Some old blood was evacuated but the wounds were clean and healthy appearing and they were able to be closed without tension.   INDICATIONS:   Erik Barker is a 52 y.o. male who presents with VAC dressings in place after fasciotomies were performed for compartment syndrome of the left hip few days ago by orthopedics.  He is brought back for washout with VAC dressing change or delayed primary closure depending on the tension of the wounds.  Risks and benefits were discussed and informed consent was obtained.  DESCRIPTION: After obtaining full informed written consent, the patient was brought back to the operating room and placed on beanbag with his left side up upon the operating table.  The patient was prepped and draped in the standard fashion.  The existing VAC dressings were removed and the wounds were painted with Betadine.  There was some old hematoma from the anterior of the 2 wounds that was evacuated but no active bleeding.  The wounds were copiously irrigated with sterile saline.  The wounds were clean and healthy appearing with viable tissue.  The wounds could then be closed in a tension-free fashion.  The wounds were then closed with a series of interrupted 2-0 nylon mattress sutures with approximately 6 sutures placed in the anterior wound and 10 sutures placed in the posterior wound.  An incisional (disposable) VAC dressing was then placed.  The suction sponge was placed with strips of occlusive dressing over the wounds.  The wounds  were dressed with a incisional (disposable) negative pressure dressing separately so 2 incisional vacs were used and a good occlusive seal's were obtained on both locations. At this point, we elected to complete the procedure.  The patient was taken to the recovery room in stable condition.   COMPLICATIONS: None  CONDITION: Stable  Festus Barren  12/21/2022, 8:43 AM    This note was created with Dragon Medical transcription system. Any errors in dictation are purely unintentional.

## 2022-12-21 NOTE — Transfer of Care (Signed)
Immediate Anesthesia Transfer of Care Note  Patient: Erik Barker  Procedure(s) Performed: APPLICATION OF WOUND VAC vs WOUND CLOSURE HIP LEG (Left)  Patient Location: PACU  Anesthesia Type:General  Level of Consciousness: awake, oriented, and drowsy  Airway & Oxygen Therapy: Patient Spontanous Breathing  Post-op Assessment: Report given to RN and Post -op Vital signs reviewed and stable  Post vital signs: Reviewed and stable  Last Vitals:  Vitals Value Taken Time  BP 106/68 12/21/22 0833  Temp 37.4 C 12/21/22 0830  Pulse 89 12/21/22 0833  Resp 13 12/21/22 0833  SpO2 100 % 12/21/22 0833  Vitals shown include unfiled device data.  Last Pain:  Vitals:   12/21/22 0830  TempSrc:   PainSc: 0-No pain      Patients Stated Pain Goal: 0 (12/20/22 1828)  Complications: No notable events documented.

## 2022-12-21 NOTE — Anesthesia Postprocedure Evaluation (Signed)
Anesthesia Post Note  Patient: MARTA LAYDEN  Procedure(s) Performed: APPLICATION OF WOUND VAC vs WOUND CLOSURE HIP LEG (Left)  Patient location during evaluation: PACU Anesthesia Type: General Level of consciousness: awake and alert Pain management: pain level controlled Vital Signs Assessment: post-procedure vital signs reviewed and stable Respiratory status: spontaneous breathing, nonlabored ventilation, respiratory function stable and patient connected to nasal cannula oxygen Cardiovascular status: blood pressure returned to baseline and stable Postop Assessment: no apparent nausea or vomiting Anesthetic complications: no   No notable events documented.   Last Vitals:  Vitals:   12/21/22 0858 12/21/22 0912  BP: 98/60 106/65  Pulse: 67 70  Resp: (!) 9 18  Temp: 36.7 C 36.6 C  SpO2: 99% 99%    Last Pain:  Vitals:   12/21/22 0925  TempSrc:   PainSc: 8                  Cleda Mccreedy Khi Mcmillen

## 2022-12-22 ENCOUNTER — Encounter: Payer: Self-pay | Admitting: Vascular Surgery

## 2022-12-22 LAB — COMPREHENSIVE METABOLIC PANEL
ALT: 129 U/L — ABNORMAL HIGH (ref 0–44)
AST: 160 U/L — ABNORMAL HIGH (ref 15–41)
Albumin: 2.5 g/dL — ABNORMAL LOW (ref 3.5–5.0)
Alkaline Phosphatase: 71 U/L (ref 38–126)
Anion gap: 7 (ref 5–15)
BUN: 16 mg/dL (ref 6–20)
CO2: 27 mmol/L (ref 22–32)
Calcium: 8 mg/dL — ABNORMAL LOW (ref 8.9–10.3)
Chloride: 99 mmol/L (ref 98–111)
Creatinine, Ser: 0.64 mg/dL (ref 0.61–1.24)
GFR, Estimated: >60 mL/min (ref >60)
Glucose, Bld: 110 mg/dL — ABNORMAL HIGH (ref 70–99)
Potassium: 4.1 mmol/L (ref 3.5–5.1)
Sodium: 133 mmol/L — ABNORMAL LOW (ref 135–145)
Total Bilirubin: 0.5 mg/dL (ref 0.3–1.2)
Total Protein: 5.4 g/dL — ABNORMAL LOW (ref 6.5–8.1)

## 2022-12-22 LAB — CK: Total CK: 1285 U/L — ABNORMAL HIGH (ref 49–397)

## 2022-12-22 NOTE — Progress Notes (Signed)
Progress Note    12/22/2022 2:50 PM 1 Day Post-Op  Subjective:  Erik Barker is a 52 y.o. male with medical history significant for polysubstance use disorder including alcohol use disorder, opioid use disorder, who is now postop day 1 from left lower extremity fasciotomy closures.  Patient is resting comfortably in bed this morning and recovering as expected.  Patient does endorse pain to his left hip area where the fasciotomies have been closed.  Prevena wound vacs are in place and working properly.  Vitals are remained stable   Vitals:   12/22/22 0307 12/22/22 0803  BP: 118/74 105/65  Pulse: 79 72  Resp: 15 18  Temp: (!) 97.4 F (36.3 C) (!) 97.2 F (36.2 C)  SpO2: 100% 97%   Physical Exam: Cardiac:  RRR, normal S1 and S2.  No murmurs appreciated. Lungs: On auscultation all lung fields are clear.  No rales, rhonchi or wheezing noted. Incisions: Left anterior posterior fasciotomy incisions are now closed.  Prevena wound VAC in place.  Anterior wound VAC collection catheter was changed this morning.  Drainage was serosanguineous. Extremities: Bilateral lower extremities with palpable pulses both DP/PT. Abdomen: Positive bowel sounds throughout, soft, nontender and nondistended. Neurologic: Alert and oriented x 3, follows commands and answers all questions appropriately.  CBC    Component Value Date/Time   WBC 6.8 12/21/2022 0501   RBC 4.67 12/21/2022 0501   HGB 13.5 12/21/2022 0501   HGB 16.3 05/28/2013 0430   HCT 41.6 12/21/2022 0501   HCT 49.2 05/28/2013 0430   PLT 233 12/21/2022 0501   PLT 225 05/28/2013 0430   MCV 89.1 12/21/2022 0501   MCV 89 05/28/2013 0430   MCH 28.9 12/21/2022 0501   MCHC 32.5 12/21/2022 0501   RDW 15.0 12/21/2022 0501   RDW 15.7 (H) 05/28/2013 0430   LYMPHSABS 1.2 12/18/2022 0044   LYMPHSABS 2.0 11/27/2011 0512   MONOABS 0.4 12/18/2022 0044   MONOABS 0.7 11/27/2011 0512   EOSABS 0.1 12/18/2022 0044   EOSABS 0.1 11/27/2011 0512    BASOSABS 0.0 12/18/2022 0044   BASOSABS 0.1 11/27/2011 0512    BMET    Component Value Date/Time   NA 133 (L) 12/22/2022 0319   NA 139 05/28/2013 0320   K 4.1 12/22/2022 0319   K 3.9 05/28/2013 0320   CL 99 12/22/2022 0319   CL 110 (H) 05/28/2013 0320   CO2 27 12/22/2022 0319   CO2 25 05/28/2013 0320   GLUCOSE 110 (H) 12/22/2022 0319   GLUCOSE 98 05/28/2013 0320   BUN 16 12/22/2022 0319   BUN 7 05/28/2013 0320   CREATININE 0.64 12/22/2022 0319   CREATININE 1.08 05/28/2013 0320   CALCIUM 8.0 (L) 12/22/2022 0319   CALCIUM 8.6 05/28/2013 0320   GFRNONAA >60 12/22/2022 0319   GFRNONAA >60 05/28/2013 0320   GFRAA >60 05/28/2013 0320    INR    Component Value Date/Time   INR 1.1 12/18/2022 0107   INR 0.9 11/27/2011 0512     Intake/Output Summary (Last 24 hours) at 12/22/2022 1450 Last data filed at 12/22/2022 0900 Gross per 24 hour  Intake 1416.62 ml  Output 1750 ml  Net -333.38 ml     Assessment/Plan:  52 y.o. male is s/p left lower extremity anterior posterior hip fasciotomy closure.  1 Day Post-Op   PLAN: Will continue to monitor Prevena wound vacs closely.  Anterior wound vacs collection canister was changed this morning as it was full with serosanguineous fluid.  Posterior Prevena  wound VAC is dry at this point in time.  No hematoma to note.  Patient does have erythema to his left posterior flank side of his chest which was apparent prior to surgery.  Vascular surgery expects output to the anterior Prevena wound VAC for another 48 hours.  Continue to monitor.  Continue pain medicine as needed Advance diet as tolerated. Continue to work with PT/OT.   DVT prophylaxis: Lovenox 40 mg SQ every 24 hours.   Marcie [facility] 12/22/2022 2:50 PM

## 2022-12-22 NOTE — TOC Progression Note (Signed)
Transition of Care [facility]) - Progression Note    Patient Details  Name: Erik Barker MRN: [id number] Date of Birth: [birth year]  Transition of [facility] CM/SW Contact  Chapman Fitch, RN Phone Number: 12/22/2022, 3:55 PM  Clinical Narrative:     Per RN patient would benefit from PT eval.  MD aware  Confirmed with bedside RN that patient has 2 prevena vacs in place   Expected Discharge Plan: Home/Self Care Barriers to Discharge: Continued Medical Work up  Expected Discharge Plan and Services                                               Social Determinants of Health (SDOH) Interventions SDOH Screenings   Food Insecurity: No Food Insecurity (12/18/2022)  Housing: Low Risk  (12/18/2022)  Transportation Needs: No Transportation Needs (12/18/2022)  Utilities: Not At Risk (12/18/2022)  Tobacco Use: High Risk (12/18/2022)    Readmission Risk Interventions     No data to display

## 2022-12-22 NOTE — Progress Notes (Addendum)
Progress Note    Erik Barker  WGN:[id number] DOB: [birth year]  DOA: 12/17/2022 PCP: Pcp, No      Brief Narrative:    Medical records reviewed and are as summarized below:  Erik Barker is a 52 y.o. male with medical history significant for polysubstance use disorder including alcohol use disorder, opioid use disorder, who presented to the hospital with worsening left leg pain and numbness.  He said he had used heroin and alcohol on Wednesday, 12/14/2022 and thinks he might have passed out.  He woke up the following morning and noticed weakness in the left leg.  He developed pressure on the left thigh and left buttock associated with numbness in the left foot.  Symptoms persisted so he presented to the emergency department for further management.      Assessment/Plan:   Principal Problem:   Compartment syndrome of buttock (HCC) Active Problems:   Alcohol abuse   Compartment syndrome of left lower extremity (HCC)   Encounter for postoperative wound care    Body mass index is 24.83 kg/m.   Left buttock and anterior left hip compartment syndrome: S/p fasciotomies of gluteus maximus and gluteus medius and minimus muscles on 12/18/2022. S/p delayed primary closure of 2 left hip fasciotomy wounds, incisional VAC dressings placed on each wound on 12/21/2022. Continue analgesics for pain.  Follow-up with vascular and orthopedic surgeons for further recommendations.   Rhabdomyolysis: CK improved from 45,514-11,775-5,033-2,577-1,285.   Elevated liver enzymes, fatty liver: Liver enzymes are trending down.  Significantly elevated liver enzymes were likely from rhabdomyolysis.    Polysubstance use disorder (opioids, benzodiazepines, cocaine, marijuana): Counseled to quit using illicit drugs.   Leukocytosis: Resolved   Mild hyponatremia: Asymptomatic   Diet Order             Diet regular Room service appropriate? Yes; Fluid consistency: Thin  Diet effective now                             Consultants: Orthopedic surgeon Vascular surgeon  Procedures: Left buttock and anterior left hip fasciotomies   Medications:    enoxaparin (LOVENOX) injection  40 mg Subcutaneous Q24H   folic acid  1 mg Oral Daily   multivitamin with minerals  1 tablet Oral Daily   nicotine  21 mg Transdermal Daily   polyethylene glycol  17 g Oral Daily   thiamine  100 mg Oral Daily   Or   thiamine  100 mg Intravenous Daily   Continuous Infusions:     Anti-infectives (From admission, onward)    Start     Dose/Rate Route Frequency Ordered Stop   12/20/22 2210  ceFAZolin (ANCEF) IVPB 2g/100 mL premix        2 g 200 mL/hr over 30 Minutes Intravenous 30 min pre-op 12/20/22 2210 12/21/22 0752              Family Communication/Anticipated D/C date and plan/Code Status   DVT prophylaxis: enoxaparin (LOVENOX) injection 40 mg Start: 12/18/22 2200 SCDs Start: 12/18/22 0816     Code Status: Full Code  Family Communication: None Disposition Plan: Plan to discharge home   Status is: Inpatient Remains inpatient appropriate because: Left buttock compartment syndrome       Subjective:   He still has pain in the left hip and left buttock.    Objective:    Vitals:   12/21/22 1638 12/21/22 2030 12/22/22 7846 12/22/22 9629  BP: (!) 105/59 120/72 118/74 105/65  Pulse: 72 84 79 72  Resp: 18 15 15 18   Temp: 98.7 F (37.1 C) 99.6 F (37.6 C) (!) 97.4 F (36.3 C) (!) 97.2 F (36.2 C)  TempSrc:  Oral Oral   SpO2: 97% 98% 100% 97%  Weight:      Height:       No data found.   Intake/Output Summary (Last 24 hours) at 12/22/2022 1113 Last data filed at 12/22/2022 0900 Gross per 24 hour  Intake 1416.62 ml  Output 1750 ml  Net -333.38 ml   Filed Weights   12/17/22 2345 12/21/22 0651  Weight: 82.6 kg 80.7 kg    Exam:  GEN: NAD SKIN: Wound VAC on the left hip and left buttock surgical wounds EYES: No pallor or icterus ENT:  MMM CV: RRR PULM: CTA B ABD: soft, ND, NT, +BS CNS: AAO x 3, non focal EXT: Tenderness around the left hip and left buttock     Data Reviewed:   I have personally reviewed following labs and imaging studies:  Labs: Labs show the following:   Basic Metabolic Panel: Recent Labs  Lab 12/18/22 0107 12/19/22 0553 12/20/22 0735 12/21/22 0501 12/22/22 0319  NA 134* 137 135 136 133*  K 4.1 4.1 3.7 4.1 4.1  CL 102 104 102 104 99  CO2 24 27 28 28 27   GLUCOSE 116* 149* 130* 104* 110*  BUN 19 11 10 12 16   CREATININE 0.65 0.59* 0.58* 0.62 0.64  CALCIUM 8.1* 8.0* 7.9* 8.1* 8.0*   GFR Estimated Creatinine Clearance: 113.7 mL/min (by C-G formula based on SCr of 0.64 mg/dL). Liver Function Tests: Recent Labs  Lab 12/18/22 0107 12/19/22 0553 12/20/22 0735 12/21/22 0501 12/22/22 0319  AST 1,010* 402* 333* 229* 160*  ALT 307* 214* 186* 156* 129*  ALKPHOS 83 69 59 66 71  BILITOT 0.5 0.4 0.2* 0.5 0.5  PROT 6.4* 5.2* 5.2* 5.5* 5.4*  ALBUMIN 3.1* 2.5* 2.5* 2.4* 2.5*   No results for input(s): "LIPASE", "AMYLASE" in the last 168 hours. No results for input(s): "AMMONIA" in the last 168 hours. Coagulation profile Recent Labs  Lab 12/18/22 0107  INR 1.1    CBC: Recent Labs  Lab 12/18/22 0044 12/19/22 0553 12/20/22 0735 12/21/22 0501  WBC 12.0* 14.2* 7.2 6.8  NEUTROABS 10.3*  --   --   --   HGB 14.0 12.9* 13.2 13.5  HCT 42.7 38.7* 40.0 41.6  MCV 88.4 87.8 88.5 89.1  PLT 233 238 219 233   Cardiac Enzymes: Recent Labs  Lab 12/18/22 0106 12/19/22 0553 12/20/22 0735 12/21/22 0501 12/22/22 0319  CKTOTAL 45,514* 11,775* 5,035* 2,577* 1,285*   BNP (last 3 results) No results for input(s): "PROBNP" in the last 8760 hours. CBG: Recent Labs  Lab 12/21/22 0912  GLUCAP 99   D-Dimer: No results for input(s): "DDIMER" in the last 72 hours. Hgb A1c: No results for input(s): "HGBA1C" in the last 72 hours. Lipid Profile: No results for input(s): "CHOL", "HDL",  "LDLCALC", "TRIG", "CHOLHDL", "LDLDIRECT" in the last 72 hours. Thyroid function studies: No results for input(s): "TSH", "T4TOTAL", "T3FREE", "THYROIDAB" in the last 72 hours.  Invalid input(s): "FREET3" Anemia work up: No results for input(s): "VITAMINB12", "FOLATE", "FERRITIN", "TIBC", "IRON", "RETICCTPCT" in the last 72 hours. Sepsis Labs: Recent Labs  Lab 12/18/22 0044 12/19/22 0553 12/20/22 0735 12/21/22 0501  WBC 12.0* 14.2* 7.2 6.8    Microbiology No results found for this or any previous visit (from the  past 240 hour(s)).  Procedures and diagnostic studies:  No results found.             LOS: 4 days   Geroldine Esquivias  [facility]   Pager on [email or web address]. If 7PM-7AM, please contact night-coverage at [email or web address]     12/22/2022, 11:13 AM

## 2022-12-22 NOTE — Evaluation (Signed)
Physical Therapy Evaluation Patient Details Name: Erik Barker MRN: [id number] DOB: 10/26/1970 Today's Date: 12/22/2022  History of Present Illness  Erik Barker is a 52 y.o. male with medical history significant for polysubstance use disorder including alcohol use disorder, opioid use disorder, who presented to the hospital with worsening left leg pain and numbness.  He said he had used heroin and alcohol on Wednesday, 12/14/2022 and thinks he might have passed out.  He woke up the following morning and noticed weakness in the left leg.  He developed pressure on the left thigh and left buttock associated with numbness in the left foot.  Symptoms persisted so he presented to the emergency department for further management.   Clinical Impression  Pt received in supine position and initially unwilling to work with therapy due to pain in the L buttock region where wound is located.  With encouragement, pt willing to participate.  Pt performed bed mobility however attempts to displace the weight off of the L side due to the pain which causes his balance to be reduced.  Pt then stood and donned gown with good balance without UE support.  Pt ambulated down to nursing station and around the loop before returning.  Pt much more steady with use of the RW and is requesting one at discharge if possible.  Pt would continue from ambulation ad lib with nursing staff at this time.  All education completed by PT and signing off on pt.        If plan is discharge home, recommend the following: Assist for transportation;Help with stairs or ramp for entrance   Can travel by private vehicle        Equipment Recommendations Rolling walker (2 wheels)  Recommendations for Other Services       Functional Status Assessment Patient has had a recent decline in their functional status and demonstrates the ability to make significant improvements in function in a reasonable and predictable amount of time.      Precautions / Restrictions Restrictions Weight Bearing Restrictions: No      Mobility  Bed Mobility Overal bed mobility: Modified Independent                  Transfers Overall transfer level: Modified independent                      Ambulation/Gait Ambulation/Gait assistance: Modified independent (Device/Increase time) Gait Distance (Feet): 240 Feet Assistive device: Rolling walker (2 wheels) Gait Pattern/deviations: WFL(Within Functional Limits) Gait velocity: decreased     General Gait Details: pt typically ambulates with no AD, but requested RW due to pain in the L gluteal region and utilized the RW for pain management.  Stairs            Wheelchair Mobility     Tilt Bed    Modified Rankin (Stroke Patients Only)       Balance Overall balance assessment: Needs assistance Sitting-balance support: Feet supported, No upper extremity supported Sitting balance-Leahy Scale: Good Sitting balance - Comments: R lateral lean due to pain in the L LE wound Postural control: Right lateral lean   Standing balance-Leahy Scale: Fair Standing balance comment: pt has more stability with RW.                             Pertinent Vitals/Pain Pain Assessment Pain Assessment: 0-10 Pain Score: 8  Pain Location: L buttocks Pain Descriptors /  Indicators: Dull, Aching Pain Intervention(s): Limited activity within patient's tolerance, Monitored during session    Home Living Family/patient expects to be discharged to:: Private residence Living Arrangements: Non-relatives/Friends Available Help at Discharge: Friend(s) Type of Home: House Home Access: Stairs to enter Entrance Stairs-Rails: Can reach both Entrance Stairs-Number of Steps: 2   Home Layout: One level Home Equipment: None      Prior Function Prior Level of Function : Independent/Modified Independent                     Extremity/Trunk Assessment   Upper Extremity  Assessment Upper Extremity Assessment: Overall WFL for tasks assessed    Lower Extremity Assessment Lower Extremity Assessment: Overall WFL for tasks assessed       Communication   Communication Communication: No apparent difficulties  Cognition Arousal: Alert Behavior During Therapy: WFL for tasks assessed/performed Overall Cognitive Status: Within Functional Limits for tasks assessed                                          General Comments      Exercises     Assessment/Plan    PT Assessment Patient does not need any further PT services  PT Problem List         PT Treatment Interventions      PT Goals (Current goals can be found in the Care Plan section)  Acute Rehab PT Goals Patient Stated Goal: to get a walker for stability and return to work asap. PT Goal Formulation: With patient Time For Goal Achievement: 01/05/23 Potential to Achieve Goals: Good    Frequency       Co-evaluation               AM-PAC PT "6 Clicks" Mobility  Outcome Measure Help needed turning from your back to your side while in a flat bed without using bedrails?: None Help needed moving from lying on your back to sitting on the side of a flat bed without using bedrails?: None Help needed moving to and from a bed to a chair (including a wheelchair)?: None Help needed standing up from a chair using your arms (e.g., wheelchair or bedside chair)?: None Help needed to walk in hospital room?: None Help needed climbing 3-5 steps with a railing? : A Little 6 Click Score: 23    End of Session   Activity Tolerance: Patient tolerated treatment well Patient left: in bed;with call bell/phone within reach Nurse Communication: Mobility status PT Visit Diagnosis: Unsteadiness on feet (R26.81);Muscle weakness (generalized) (M62.81)    Time: 8119-1478 PT Time Calculation (min) (ACUTE ONLY): 17 min   Charges:   PT Evaluation $PT Eval Low Complexity: 1 Low   PT General  Charges $$ ACUTE PT VISIT: 1 Visit         Nolon Bussing, PT, DPT Physical Therapist - [facility]  12/22/22, 3:30 PM

## 2022-12-23 ENCOUNTER — Other Ambulatory Visit: Payer: Self-pay

## 2022-12-23 DIAGNOSIS — M6282 Rhabdomyolysis: Secondary | ICD-10-CM | POA: Diagnosis present

## 2022-12-23 MED ORDER — OXYCODONE HCL 5 MG PO TABS
5.0000 mg | ORAL_TABLET | Freq: Three times a day (TID) | ORAL | 0 refills | Status: DC | PRN
  Filled 2022-12-23: qty 9, 3d supply, fill #0

## 2022-12-23 MED ORDER — ACETAMINOPHEN 325 MG PO TABS: 650.0000 mg | ORAL_TABLET | Freq: Four times a day (QID) | ORAL | Status: DC | PRN

## 2022-12-23 NOTE — Progress Notes (Signed)
MD notified: Any idea when this patient might be going home. He reports he is ready to go home. Reports he does not want to use nicotine patches and has been smoking in the bathroom. Also reports if he has a chance he will sneak outside to go smoke. He has not had a BM but I gave him prune juice, miralax and milk of mag.

## 2022-12-23 NOTE — Progress Notes (Signed)
Progress Note    12/23/2022 9:14 AM 2 Days Post-Op  Subjective:  Erik Barker is a 52 y.o. male with medical history significant for polysubstance use disorder including alcohol use disorder, opioid use disorder, who is now postop day #2 from left lower extremity fasciotomy closures.   Patient is resting comfortably in bed this morning and recovering as expected.  Patient does endorse pain to his left hip area where the fasciotomies have been closed.  Prevena wound vacs have no output. No complaints overnight. Nursing endorses patient smoking in the bathroom. Patient requests to go home.  Vitals are remained stable     Vitals:   12/23/22 0452 12/23/22 0909  BP: 107/65 107/69  Pulse: 74 73  Resp: 18   Temp: 98.6 F (37 C) 98.3 F (36.8 C)  SpO2: 96% 100%   Physical Exam: Cardiac:  RRR, normal S1 and S2.  No murmurs appreciated. Lungs: On auscultation all lung fields are clear.  No rales, rhonchi or wheezing noted. Incisions: Left anterior posterior fasciotomy incisions are now closed.  Prevena wound VAC in place. No drainage this morning to either Vac.  Extremities: Bilateral lower extremities with palpable pulses both DP/PT. Abdomen: Positive bowel sounds throughout, soft, nontender and nondistended. Neurologic: Alert and oriented x 3, follows commands and answers all questions appropriately. CBC    Component Value Date/Time   WBC 6.8 12/21/2022 0501   RBC 4.67 12/21/2022 0501   HGB 13.5 12/21/2022 0501   HGB 16.3 05/28/2013 0430   HCT 41.6 12/21/2022 0501   HCT 49.2 05/28/2013 0430   PLT 233 12/21/2022 0501   PLT 225 05/28/2013 0430   MCV 89.1 12/21/2022 0501   MCV 89 05/28/2013 0430   MCH 28.9 12/21/2022 0501   MCHC 32.5 12/21/2022 0501   RDW 15.0 12/21/2022 0501   RDW 15.7 (H) 05/28/2013 0430   LYMPHSABS 1.2 12/18/2022 0044   LYMPHSABS 2.0 11/27/2011 0512   MONOABS 0.4 12/18/2022 0044   MONOABS 0.7 11/27/2011 0512   EOSABS 0.1 12/18/2022 0044   EOSABS 0.1  11/27/2011 0512   BASOSABS 0.0 12/18/2022 0044   BASOSABS 0.1 11/27/2011 0512    BMET    Component Value Date/Time   NA 133 (L) 12/22/2022 0319   NA 139 05/28/2013 0320   K 4.1 12/22/2022 0319   K 3.9 05/28/2013 0320   CL 99 12/22/2022 0319   CL 110 (H) 05/28/2013 0320   CO2 27 12/22/2022 0319   CO2 25 05/28/2013 0320   GLUCOSE 110 (H) 12/22/2022 0319   GLUCOSE 98 05/28/2013 0320   BUN 16 12/22/2022 0319   BUN 7 05/28/2013 0320   CREATININE 0.64 12/22/2022 0319   CREATININE 1.08 05/28/2013 0320   CALCIUM 8.0 (L) 12/22/2022 0319   CALCIUM 8.6 05/28/2013 0320   GFRNONAA >60 12/22/2022 0319   GFRNONAA >60 05/28/2013 0320   GFRAA >60 05/28/2013 0320    INR    Component Value Date/Time   INR 1.1 12/18/2022 0107   INR 0.9 11/27/2011 0512     Intake/Output Summary (Last 24 hours) at 12/23/2022 0914 Last data filed at 12/23/2022 0100 Gross per 24 hour  Intake 840 ml  Output 1500 ml  Net -660 ml     Assessment/Plan:  52 y.o. male is s/p left lower extremity anterior and  posterior hip fasciotomy closure.  2 Days Post-Op   PLAN: Remove both Prevena Wound vacs this morning.  Dressings to be applied: Zeroform, covered by 4X4's, covered by ABD pads,  Tape in place. Change dressing every other day.  Okay to discharge to home today.  [facility] for suture removal in 3 weeks.    DVT prophylaxis:  Lovenox 40 mg SQ every 24 hours.    Marcie [facility] 12/23/2022 9:14 AM

## 2022-12-23 NOTE — TOC Progression Note (Addendum)
Transition of Care [facility]) - Progression Note    Patient Details  Name: Erik Barker MRN: [id number] Date of Birth: 12/08/1970  [facility]) CM/SW Contact  Darolyn Rua, [place] Phone Number: 12/23/2022, 10:14 AM  Clinical Narrative:     Per MD patient would benefit for [facility] RN for wound care.   CSW notes patient does not have PCP or insurance. [facility] info was prevously provided 9/10. Family to transport patient home at discharge and patient requested taht med management   Due to [facility] need , CSW has arranged PCP apt with [facility] Monday October 14th 2:20 pm  Shane Crutch   CSW has reached out to charity [facility] [place] with [employer], they are reviewing .  Update 12:00 pm: [facility] unable to accept due to hx of substance abuse. Patient also has no insurance and no pcp.   CSW has informed vascular NP of above, patient's wound vacs were removed prior to dc and has an apt with vascular in a few weeks for suture removal. Patient aware of dressing changes every other day, family to assist.   Patient did request walker prior to dc, TOC charity is providing.   Expected Discharge Plan: Home/Self Care Barriers to Discharge: Continued Medical Work up  Expected Discharge Plan and Services                                               Social Determinants of Health (SDOH) Interventions SDOH Screenings   Food Insecurity: No Food Insecurity (12/18/2022)  Housing: Low Risk  (12/18/2022)  Transportation Needs: No Transportation Needs (12/18/2022)  Utilities: Not At Risk (12/18/2022)  Tobacco Use: High Risk (12/18/2022)    Readmission Risk Interventions     No data to display

## 2022-12-23 NOTE — Discharge Instructions (Addendum)
No driving until return to clinic and sutures are removed.  Dressing care to both incisions are as follows:  Remove old dressing Cover with sutures with Zeroform Gauze  Apply 4X4's to cover Zeroform Cover 4X4's with Abd Pads. Secure with tape.  Dressing Change every other day!  Follow up with [facility] as Scheduled
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
## 2022-12-23 NOTE — Plan of Care (Signed)
Adequate for discharge, Resolve care plan.  Erik Barker Erik Barker

## 2022-12-23 NOTE — Plan of Care (Signed)

## 2023-01-13 ENCOUNTER — Ambulatory Visit (INDEPENDENT_AMBULATORY_CARE_PROVIDER_SITE_OTHER): Payer: Self-pay | Admitting: Nurse Practitioner

## 2023-07-11 ENCOUNTER — Other Ambulatory Visit
Admission: RE | Admit: 2023-07-11 | Discharge: 2023-07-11 | Disposition: A | Discharge status: Home / Self Care | Payer: Self-pay | Attending: Infectious Diseases | Admitting: Infectious Diseases

## 2023-07-11 ENCOUNTER — Ambulatory Visit: Payer: MEDICAID | Attending: Infectious Diseases | Admitting: Infectious Diseases

## 2023-07-11 ENCOUNTER — Encounter: Payer: Self-pay | Admitting: Infectious Diseases

## 2023-07-11 VITALS — BP 124/73 | HR 67 | Temp 97.5°F | Ht 71.0 in | Wt 166.0 lb

## 2023-07-11 DIAGNOSIS — Z5902 Unsheltered homelessness: Secondary | ICD-10-CM | POA: Insufficient documentation

## 2023-07-11 DIAGNOSIS — B182 Chronic viral hepatitis C: Secondary | ICD-10-CM

## 2023-07-11 LAB — COMPREHENSIVE METABOLIC PANEL WITH GFR
ALT: 45 U/L — ABNORMAL HIGH (ref 0–44)
AST: 48 U/L — ABNORMAL HIGH (ref 15–41)
Albumin: 4.2 g/dL (ref 3.5–5.0)
Alkaline Phosphatase: 67 U/L (ref 38–126)
Anion gap: 8 (ref 5–15)
BUN: 20 mg/dL (ref 6–20)
CO2: 25 mmol/L (ref 22–32)
Calcium: 8.9 mg/dL (ref 8.9–10.3)
Chloride: 103 mmol/L (ref 98–111)
Creatinine, Ser: 0.77 mg/dL (ref 0.61–1.24)
GFR, Estimated: >60 mL/min (ref >60)
Glucose, Bld: 94 mg/dL (ref 70–99)
Potassium: 4 mmol/L (ref 3.5–5.1)
Sodium: 136 mmol/L (ref 135–145)
Total Bilirubin: 1.2 mg/dL (ref 0.0–1.2)
Total Protein: 7.4 g/dL (ref 6.5–8.1)

## 2023-07-11 LAB — CBC WITH DIFFERENTIAL/PLATELET
Abs Immature Granulocytes: 0.01 10*3/uL (ref 0.00–0.07)
Basophils Absolute: 0 10*3/uL (ref 0.0–0.1)
Basophils Relative: 1 %
Eosinophils Absolute: 0.1 10*3/uL (ref 0.0–0.5)
Eosinophils Relative: 2 %
HCT: 41.6 % (ref 39.0–52.0)
Hemoglobin: 14.4 g/dL (ref 13.0–17.0)
Immature Granulocytes: 0 %
Lymphocytes Relative: 26 %
Lymphs Abs: 1.3 10*3/uL (ref 0.7–4.0)
MCH: 29.5 pg (ref 26.0–34.0)
MCHC: 34.6 g/dL (ref 30.0–36.0)
MCV: 85.2 fL (ref 80.0–100.0)
Monocytes Absolute: 0.3 10*3/uL (ref 0.1–1.0)
Monocytes Relative: 6 %
Neutro Abs: 3.3 10*3/uL (ref 1.7–7.7)
Neutrophils Relative %: 65 %
Platelets: 223 10*3/uL (ref 150–400)
RBC: 4.88 MIL/uL (ref 4.22–5.81)
RDW: 14.7 % (ref 11.5–15.5)
WBC: 5.1 10*3/uL (ref 4.0–10.5)
nRBC: 0 % (ref 0.0–0.2)

## 2023-07-11 LAB — HIV ANTIBODY (ROUTINE TESTING W REFLEX): HIV Screen 4th Generation wRfx: NONREACTIVE

## 2023-07-11 LAB — HEPATITIS A ANTIBODY, TOTAL: hep A Total Ab: NONREACTIVE

## 2023-07-11 LAB — HEPATITIS B SURFACE ANTIGEN: Hepatitis B Surface Ag: NONREACTIVE
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
## 2023-07-11 NOTE — Patient Instructions (Signed)
 Erik Lent., a 53 year old male with hepatitis C, visited to start treatment for his condition. He is currently homeless and living in a tent, which may affect his ability to store and take medications consistently. He occasionally uses marijuana and cocaine and consumes alcohol, which he acknowledges he needs to stop due to its impact on his liver health. He has not been treated for hepatitis C before and is eager to begin treatment.  YOUR PLAN:  -HEPATITIS C: Hepatitis C is a liver infection caused by the hepatitis C virus. It can lead to liver damage if not treated. You will start treatment with Mavyret, which involves taking three pills daily for 8 weeks or Epclusa 1 pill a day for 12 weeks. Baseline blood tests will be done to assess your viral load and liver status. We will apply for assistance programs to get the medication for free. It is crucial to take your medication daily to prevent resistance and ensure it works effectively. You should also stop drinking alcohol to avoid further liver damage. Regular follow-ups will be scheduled to monitor your progress.  -SUBSTANCE USE: Substance use, including marijuana and cocaine, can impact your ability to stick to your hepatitis C treatment plan. It is important to avoid these substances to ensure you can take your medication daily and effectively treat your hepatitis C. We discussed the importance of adherence to your treatment regimen, and you are committed to following it despite these challenges. Also we discussed regarding alcohol and the improtance of quitting it completely before the treatment  INSTRUCTIONS:  . Complete the baseline blood tests to assess your hepatitis C viral load and liver status. Once your medicine is approved we will let you know. Avoid alcohol and substance use to ensure the effectiveness of your treatment. Attend regular follow-up appointments to monitor your treatment adherence and response.

## 2023-07-12 LAB — HCV RNA QUANT

## 2023-07-12 LAB — HEPATITIS B CORE ANTIBODY, TOTAL: HEP B CORE AB: NEGATIVE

## 2023-07-12 LAB — HCV RNA (INTERNATIONAL UNITS)
HCV RNA (International Units): 12600000 [IU]/mL
HCV log10: 7.1 {Log_IU}/mL

## 2023-07-12 LAB — HEPATITIS B SURFACE ANTIBODY, QUANTITATIVE: Hep B S AB Quant (Post): <3.5 m[IU]/mL — ABNORMAL LOW

## 2023-07-13 LAB — HCV FIBROSURE
ALPHA 2-MACROGLOBULINS, QN: 200 mg/dL (ref 110–276)
ALT (SGPT) P5P: 52 IU/L (ref 0–55)
Apolipoprotein A-1: 152 mg/dL (ref 101–178)
Bilirubin, Total: 0.5 mg/dL (ref 0.0–1.2)
Fibrosis Score: 0.2 (ref 0.00–0.21)
GGT: 12 IU/L (ref 0–65)
Haptoglobin: 90 mg/dL (ref 29–370)
Necroinflammat Activity Score: 0.27 — ABNORMAL HIGH (ref 0.00–0.17)

## 2023-07-13 LAB — HEPATITIS C GENOTYPE

## 2023-07-17 ENCOUNTER — Other Ambulatory Visit (HOSPITAL_COMMUNITY): Payer: Self-pay

## 2023-07-18 NOTE — Progress Notes (Signed)
 Yes, let's go ahead and do [employer]. Thanks!

## 2023-07-19 ENCOUNTER — Other Ambulatory Visit (HOSPITAL_COMMUNITY): Payer: Self-pay

## 2023-07-20 ENCOUNTER — Telehealth: Payer: Self-pay

## 2023-07-20 NOTE — Telephone Encounter (Signed)
[facility] Patient Advocate Encounter ° °Completed and sent MYABBVIE application for Mavyret for this patient who is uninsured.   ° °Patient assistance phone number for follow up is [phone].  ° °This encounter will be updated until final determination.  ° °Ayda Tancredi, CPhT °Specialty Pharmacy Patient Advocate °[facility] °Phone: [phone] °Fax:  [phone] ° °

## 2023-07-24 ENCOUNTER — Telehealth: Payer: Self-pay

## 2023-07-24 NOTE — Telephone Encounter (Signed)
 [facility] Patient Advocate Encounter  Completed and sent MYABBVIE application for Mavyret for this patient who is uninsured.    Patient is approved 07/21/23 through 01/20/24.  Medication will be shipped to the office on are after 07/27/23.   Roylene Corn, CPhT Specialty Pharmacy Patient [facility] Phone: [phone] Fax:  [phone]

## 2023-07-27 ENCOUNTER — Telehealth: Payer: Self-pay

## 2023-07-27 NOTE — Telephone Encounter (Signed)
 [facility] Patient Advocate Encounter  Patient's medications Mavyret have been couriered to [facility] from [facility] and will be picked up on 08/01/23.  1st Mavyret Box   Patient/ Friend will pick medication up at [facility].  Roylene Corn, CPhT Specialty Pharmacy Patient [facility] for Infectious Disease Phone: [phone] Fax:  [phone]

## 2023-08-01 NOTE — Telephone Encounter (Signed)
 Briefly spoke with patient over the phone today as Erik Barker provided medication to him at [facility] clinic. Discussed importance of taking 3 tablets ONCE daily with food; he verbalized instructions back to me. Understands he may experience some stomach upset, fatigue, or headache in first 1-2 weeks of starting medication which will dissipate as he continues.

## 2023-08-16 ENCOUNTER — Telehealth: Payer: Self-pay

## 2023-08-16 NOTE — Telephone Encounter (Signed)
 [facility] Patient Advocate Encounter  Patient's medications Mavyret have been couriered to [facility] from [facility] and will be picked up 08/16/23 & taking to [facility] for patient to pick up there.  2nd Mavyret box  Erik Barker, CPhT Specialty Pharmacy Patient [facility] for Infectious Disease Phone: [phone] Fax:  [phone]

## 2023-09-01 ENCOUNTER — Telehealth: Payer: Self-pay
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
## 2023-09-07 ENCOUNTER — Ambulatory Visit: Payer: MEDICAID | Attending: Infectious Diseases | Admitting: Infectious Diseases

## 2023-09-07 ENCOUNTER — Encounter: Payer: Self-pay | Admitting: Infectious Diseases

## 2023-09-07 ENCOUNTER — Other Ambulatory Visit (HOSPITAL_COMMUNITY): Payer: Self-pay

## 2023-09-07 VITALS — BP 129/79 | HR 56 | Temp 98.2°F | Ht 71.0 in | Wt 178.0 lb

## 2023-09-07 DIAGNOSIS — B182 Chronic viral hepatitis C: Secondary | ICD-10-CM | POA: Insufficient documentation

## 2023-09-07 DIAGNOSIS — F17210 Nicotine dependence, cigarettes, uncomplicated: Secondary | ICD-10-CM | POA: Insufficient documentation

## 2023-09-07 DIAGNOSIS — F1490 Cocaine use, unspecified, uncomplicated: Secondary | ICD-10-CM | POA: Insufficient documentation

## 2023-09-07 DIAGNOSIS — B1920 Unspecified viral hepatitis C without hepatic coma: Secondary | ICD-10-CM

## 2023-09-07 DIAGNOSIS — Z5971 Insufficient health insurance coverage: Secondary | ICD-10-CM | POA: Insufficient documentation

## 2023-09-07 DIAGNOSIS — F1290 Cannabis use, unspecified, uncomplicated: Secondary | ICD-10-CM | POA: Insufficient documentation
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
## 2023-09-12 ENCOUNTER — Ambulatory Visit: Payer: Self-pay | Admitting: Infectious Diseases

## 2023-09-22 ENCOUNTER — Other Ambulatory Visit (HOSPITAL_COMMUNITY): Payer: Self-pay

## 2023-10-03 ENCOUNTER — Telehealth: Payer: Self-pay | Admitting: Infectious Diseases

## 2023-10-03 NOTE — Telephone Encounter (Signed)
 Per Diminique, last box of Mavyret picked up at [facility] by [place], patient's friend/employer

## 2023-11-14 ENCOUNTER — Ambulatory Visit: Payer: MEDICAID | Attending: Infectious Diseases | Admitting: Infectious Diseases

## 2023-11-14 ENCOUNTER — Encounter: Payer: Self-pay | Admitting: Infectious Diseases

## 2023-11-14 ENCOUNTER — Other Ambulatory Visit
Admission: RE | Admit: 2023-11-14 | Discharge: 2023-11-14 | Disposition: A | Discharge status: Home / Self Care | Payer: Self-pay | Source: Ambulatory Visit | Attending: Infectious Diseases | Admitting: Infectious Diseases

## 2023-11-14 VITALS — BP 125/80 | HR 55 | Temp 97.7°F | Ht 71.0 in | Wt 182.0 lb

## 2023-11-14 DIAGNOSIS — F1290 Cannabis use, unspecified, uncomplicated: Secondary | ICD-10-CM | POA: Insufficient documentation

## 2023-11-14 DIAGNOSIS — Z79899 Other long term (current) drug therapy: Secondary | ICD-10-CM | POA: Insufficient documentation

## 2023-11-14 DIAGNOSIS — F1490 Cocaine use, unspecified, uncomplicated: Secondary | ICD-10-CM | POA: Insufficient documentation

## 2023-11-14 DIAGNOSIS — F17210 Nicotine dependence, cigarettes, uncomplicated: Secondary | ICD-10-CM | POA: Insufficient documentation

## 2023-11-14 DIAGNOSIS — B182 Chronic viral hepatitis C: Secondary | ICD-10-CM

## 2023-11-14 DIAGNOSIS — Z5971 Insufficient health insurance coverage: Secondary | ICD-10-CM | POA: Insufficient documentation

## 2023-11-14 DIAGNOSIS — Z59812 Housing instability, housed, homelessness in past 12 months: Secondary | ICD-10-CM | POA: Insufficient documentation

## 2023-11-14 DIAGNOSIS — Z23 Encounter for immunization: Secondary | ICD-10-CM

## 2023-11-14 LAB — COMPREHENSIVE METABOLIC PANEL WITH GFR
ALT: 18 U/L (ref 0–44)
AST: 29 U/L (ref 15–41)
Albumin: 3.7 g/dL (ref 3.5–5.0)
Alkaline Phosphatase: 67 U/L (ref 38–126)
Anion gap: 10 (ref 5–15)
BUN: 21 mg/dL — ABNORMAL HIGH (ref 6–20)
CO2: 26 mmol/L (ref 22–32)
Calcium: 9.1 mg/dL (ref 8.9–10.3)
Chloride: 101 mmol/L (ref 98–111)
Creatinine, Ser: 0.98 mg/dL (ref 0.61–1.24)
GFR, Estimated: >60 mL/min (ref >60)
Glucose, Bld: 109 mg/dL — ABNORMAL HIGH (ref 70–99)
Potassium: 3.7 mmol/L (ref 3.5–5.1)
Sodium: 137 mmol/L (ref 135–145)
Total Bilirubin: 0.7 mg/dL (ref 0.0–1.2)
Total Protein: 7.1 g/dL (ref 6.5–8.1)
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
## 2023-11-16 LAB — HCV RNA QUANT: HCV Quantitative: NOT DETECTED [IU]/mL (ref >50)

## 2023-11-29 ENCOUNTER — Ambulatory Visit: Payer: Self-pay

## 2024-05-16 ENCOUNTER — Ambulatory Visit: Payer: Self-pay | Admitting: Infectious Diseases

## 2024-05-16 ENCOUNTER — Other Ambulatory Visit
Admission: RE | Admit: 2024-05-16 | Discharge: 2024-05-16 | Disposition: A | Payer: MEDICAID | Source: Home / Self Care | Attending: Infectious Diseases | Admitting: Infectious Diseases

## 2024-05-16 ENCOUNTER — Encounter: Payer: Self-pay | Admitting: Infectious Diseases

## 2024-05-16 ENCOUNTER — Ambulatory Visit: Payer: Self-pay

## 2024-05-16 VITALS — BP 148/90 | HR 61 | Temp 97.2°F | Ht 71.0 in | Wt 200.0 lb

## 2024-05-16 DIAGNOSIS — G6289 Other specified polyneuropathies: Secondary | ICD-10-CM

## 2024-05-16 DIAGNOSIS — B182 Chronic viral hepatitis C: Secondary | ICD-10-CM

## 2024-05-16 DIAGNOSIS — E889 Metabolic disorder, unspecified: Secondary | ICD-10-CM

## 2024-05-16 DIAGNOSIS — Z23 Encounter for immunization: Secondary | ICD-10-CM

## 2024-05-16 LAB — CBC WITH DIFFERENTIAL/PLATELET
Abs Immature Granulocytes: 0.02 10*3/uL (ref 0.00–0.07)
Basophils Absolute: 0 10*3/uL (ref 0.0–0.1)
Basophils Relative: 1 %
Eosinophils Absolute: 0.2 10*3/uL (ref 0.0–0.5)
Eosinophils Relative: 3 %
HCT: 44.7 % (ref 39.0–52.0)
Hemoglobin: 14.3 g/dL (ref 13.0–17.0)
Immature Granulocytes: 0 %
Lymphocytes Relative: 21 %
Lymphs Abs: 1.4 10*3/uL (ref 0.7–4.0)
MCH: 28.5 pg (ref 26.0–34.0)
MCHC: 32 g/dL (ref 30.0–36.0)
MCV: 89 fL (ref 80.0–100.0)
Monocytes Absolute: 0.5 10*3/uL (ref 0.1–1.0)
Monocytes Relative: 8 %
Neutro Abs: 4.4 10*3/uL (ref 1.7–7.7)
Neutrophils Relative %: 67 %
Platelets: 193 10*3/uL (ref 150–400)
RBC: 5.02 MIL/uL (ref 4.22–5.81)
RDW: 14.5 % (ref 11.5–15.5)
WBC: 6.5 10*3/uL (ref 4.0–10.5)
nRBC: 0 % (ref 0.0–0.2)

## 2024-05-16 LAB — HEMOGLOBIN A1C
Hgb A1c MFr Bld: 5.4 % (ref 4.8–5.6)
Mean Plasma Glucose: 108.28 mg/dL
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
## 2024-05-17 LAB — HCV RNA QUANT: HCV Quantitative: NOT DETECTED [IU]/mL
